# Patient Record
Sex: Male | Born: 1960 | Race: White | Hispanic: No | Marital: Married | Smoking: Never smoker
Health system: Southern US, Academic
[De-identification: ages and names within clinical notes are randomized; demographics above are authoritative.]

## PROBLEM LIST (undated history)

## (undated) DIAGNOSIS — I4891 Unspecified atrial fibrillation: Secondary | ICD-10-CM

---

## 2017-09-17 ENCOUNTER — Emergency Department (EMERGENCY_DEPARTMENT_HOSPITAL): Payer: Managed Care, Other (non HMO)

## 2017-09-17 ENCOUNTER — Encounter (HOSPITAL_COMMUNITY): Payer: Self-pay

## 2017-09-17 ENCOUNTER — Emergency Department (HOSPITAL_COMMUNITY): Payer: Managed Care, Other (non HMO)

## 2017-09-17 ENCOUNTER — Emergency Department
Admission: EM | Admit: 2017-09-17 | Discharge: 2017-09-17 | Disposition: A | Discharge status: Home / Self Care | Payer: Managed Care, Other (non HMO) | Attending: Emergency Medicine | Admitting: Emergency Medicine

## 2017-09-17 DIAGNOSIS — Z7982 Long term (current) use of aspirin: Secondary | ICD-10-CM | POA: Insufficient documentation

## 2017-09-17 DIAGNOSIS — M545 Low back pain: Secondary | ICD-10-CM | POA: Insufficient documentation

## 2017-09-17 DIAGNOSIS — M549 Dorsalgia, unspecified: Secondary | ICD-10-CM

## 2017-09-17 DIAGNOSIS — I4891 Unspecified atrial fibrillation: Secondary | ICD-10-CM | POA: Insufficient documentation

## 2017-09-17 DIAGNOSIS — R202 Paresthesia of skin: Secondary | ICD-10-CM | POA: Insufficient documentation

## 2017-09-17 DIAGNOSIS — M62830 Muscle spasm of back: Secondary | ICD-10-CM | POA: Insufficient documentation

## 2017-09-17 DIAGNOSIS — Z79899 Other long term (current) drug therapy: Secondary | ICD-10-CM | POA: Insufficient documentation

## 2017-09-17 HISTORY — DX: Unspecified atrial fibrillation (CMS HCC): I48.91

## 2017-09-17 LAB — URINALYSIS, MICROSCOPIC
RBCS: <1 /HPF (ref <6.0)
RBCS: <1 /hpf (ref <6.0)
WBCS: <1 /HPF (ref <4.0)

## 2017-09-17 LAB — URINALYSIS, MACROSCOPIC
BILIRUBIN: NEGATIVE mg/dL
BLOOD: NEGATIVE mg/dL
GLUCOSE: NEGATIVE mg/dL
KETONES: NEGATIVE mg/dL
LEUKOCYTES: NEGATIVE WBCs/uL
NITRITE: NEGATIVE
PH: 7 (ref 5.0–8.0)
SPECIFIC GRAVITY: 1.025 (ref 1.005–1.030)
UROBILINOGEN: NEGATIVE mg/dL

## 2017-09-17 MED ORDER — LIDOCAINE 5 % TOPICAL PATCH
1.00 | MEDICATED_PATCH | CUTANEOUS | Status: DC
Start: 2017-09-17 — End: 2017-09-17
  Administered 2017-09-17: 700 mg via TRANSDERMAL
  Filled 2017-09-17: qty 1

## 2017-09-17 MED ORDER — DIAZEPAM 5 MG TABLET
5.00 mg | ORAL_TABLET | ORAL | Status: AC
Start: 2017-09-17 — End: 2017-09-17
  Administered 2017-09-17 (×2): 5 mg via ORAL
  Filled 2017-09-17: qty 1

## 2017-09-17 MED ORDER — LIDOCAINE 5 % TOPICAL PATCH
1.0000 | MEDICATED_PATCH | Freq: Every day | CUTANEOUS | 0 refills | Status: AC
Start: 2017-09-17 — End: ?

## 2017-09-17 MED ORDER — KETOROLAC 60 MG/2 ML INTRAMUSCULAR SOLUTION
60.00 mg | INTRAMUSCULAR | Status: AC
Start: 2017-09-17 — End: 2017-09-17
  Administered 2017-09-17: 60 mg via INTRAMUSCULAR
  Filled 2017-09-17: qty 2

## 2017-09-17 MED ORDER — METHYLPREDNISOLONE 4 MG TABLETS IN A DOSE PACK: Tab | ORAL | 0 refills | 0 days | Status: AC

## 2017-09-17 NOTE — Discharge Instructions (Signed)
Please return to the ED if any worsening symptoms. Please follow up with spine and with your PCP.  Please call the number provided on the discharge instructions to verify appointment time with Spine.  Please start using Lidoderm patches and start taking medrol dose pack as prescribed.

## 2017-09-17 NOTE — ED Provider Notes (Signed)
[place] [facility]  Emergency Department  HPI - 09/17/2017    Chief Complaint:   Back pain    History of Present Illness:   Larry Manning, 56 y.o. male arrives to the [facility] ED via POV w chief complaint of low back pain for the last 2 days. Pt reports sx began when he dried off with a towel after showering. Patient is accompanied by his wife. States that this is the second episode of back pain this year, though is much worse than the previous episode. The pt states that his back pain radiates to his buttocks and BLE. Reports associated LLE tingling. The recent course of his pain was 6 hrs. States he has been taking Tylenol q6 hours with no relief. He states that he is able to ambulate with little to no assistance. He denies any fever, bowel or bladder incontinence, saddle anesthesia, hematuria, dysuria, decreased sensation, PMHx of cancer or diabetes, PSHx to his back, and any other associates symptoms and complaint at this time. Denies any smoking or EtOH or recreational drug usage. Reports taking 25 mg Metoprolol and ASA 325 mg daily.     Of note, patient does report ~3-4 years ago he had a ~16 foot fall from a ladder onto his back.     Significant PMH: Mild Afib.   History Limitations: None    Review of Systems:   Constitutional: No fever, chills weakness   Skin: No rashes or discharge   HENT: No headaches, congestion   Eyes: No vision changes, discharge   Cardio: No chest pain, palpitations or leg swelling    Respiratory: No cough, wheezing or SOB   GI:  No nausea, vomiting, diarrhea, constipation   GU:  No dysuria, hematuria, polyuria   MSK: No joint +back pain, +LLE pain +buttocks pain   Neuro: No loss of sensation, focal deficits +LLE tingling     Medications:  None     Allergies:  No Known Allergies    Past Medical History:  Past Medical History:   Diagnosis Date   . A-fib (CMS [facility])      Past Surgical History:  History reviewed. No past surgical history pertinent negatives.     Social History:  Social History     Social History   . Marital status: Married     Spouse name: N/A   . Number of children: N/A   . Years of education: N/A     Occupational History   . Not on file.     Social History Main Topics   . Smoking status: Never Smoker   . Smokeless tobacco: Never Used   . Alcohol use No   . Drug use: No   . Sexual activity: Not on file     Other Topics Concern   . Not on file     Social History Narrative   . No narrative on file     Family History:  Family Medical History:     None        Physical Exam:  All nurse's notes reviewed.  ED Triage Vitals   Enc Vitals Group      BP (Non-Invasive) 09/17/17 1136 142/84      Heart Rate 09/17/17 1136 63      Respiratory Rate 09/17/17 1136 18      Temperature 09/17/17 1136 35.7 C (96.3 F)      Temp src --       SpO2-1 09/17/17 1136 99 %  Weight 09/17/17 1136 133.6 kg (294 lb 8.6 oz)      Height 09/17/17 1136 1.88 m ( )      Patient does not need supplemental oxygen   Constitutional: NAD. A+Ox3   HENT:    Head: NC AT    Mouth/Throat: Oropharynx is clear and moist.    Eyes: EOMI, Conjunctivae without discharge   Neck: Trachea midline.    Cardiovascular: RRR, No murmurs, rubs or gallops.    Pulmonary/Chest: BS equal bilaterally, good air movement. No respiratory distress. No wheezes, rales or chest tenderness.    Abdominal: BS +. Abdomen soft, no tenderness, rebound or guarding.                Musculoskeletal: No obvious deformity or swelling. Lower lumbar midline and paraspinal TTP. Negative straight leg raise bilaterally.    Skin: Warm and dry. No rash, erythema, pallor or cyanosis   Neurological: Alert&Ox3. Grossly intact. 5/5 strength in the BLE. SILT    Labs:  Results for orders placed or performed during the hospital encounter of 09/17/17 (from the past 24 hour(s))   URINALYSIS, MACROSCOPIC AND MICROSCOPIC W/CULTURE REFLEX    Narrative    The following orders were created for panel order URINALYSIS, MACROSCOPIC AND  MICROSCOPIC W/CULTURE REFLEX.  Procedure                               Abnormality         Status                     ---------                               -----------         ------                     URINALYSIS, MACROSCOPIC[[id number]]      Normal              Final result               URINALYSIS, MICROSCOPIC[[id number]]      Abnormal            Final result                 Please view results for these tests on the individual orders.   URINALYSIS, MACROSCOPIC   Result Value Ref Range    SPECIFIC GRAVITY 1.025 1.005 - 1.030    GLUCOSE Negative Negative mg/dL    PROTEIN Negative Negative mg/dL    BILIRUBIN Negative Negative mg/dL    UROBILINOGEN Negative Negative mg/dL    PH 7.0 5.0 - 8.0    BLOOD Negative Negative mg/dL    KETONES Negative Negative mg/dL    NITRITE Negative Negative    LEUKOCYTES Negative Negative WBCs/uL    APPEARANCE Clear Clear    COLOR Normal (Yellow) Normal (Yellow)   URINALYSIS, MICROSCOPIC   Result Value Ref Range    WBCS <1.0 <4.0 /hpf    RBCS <1.0 <6.0 /hpf    BACTERIA Few (A) Occasional or less /hpf    MUCOUS Light Light /lpf     Imaging:  Results for orders placed or performed during the hospital encounter of 09/17/17 (from the past 72 hour(s))   XR LUMBAR SPINE AP AND LAT     Status: None  Narrative    Larry Manning  Male, 56 years old.    XR LUMBAR SPINE AP AND LAT performed on 09/17/2017 1:08 PM.    REASON FOR EXAM:  back pain    TECHNIQUE: 2 views/2 images submitted for interpretation.    COMPARISON:   None available      FINDINGS:  Lumbar spinal alignment, vertebral body heights, and  intervertebral disc space heights are preserved. There is no evidence of  acute fracture or traumatic malalignment. Scattered gas is seen throughout  nondilated bowel loops. Mild fecal retention is noted in the ascending and  descending colon. There is no evidence of obstruction.      Impression    No evidence of fracture or malalignment.       Orders Placed This Encounter   . XR LUMBAR  SPINE AP AND LAT   . URINALYSIS, MACROSCOPIC AND MICROSCOPIC W/CULTURE REFLEX   . URINALYSIS, MACROSCOPIC   . URINALYSIS, MICROSCOPIC   . SCHEDULE FOLLOW-UP [facility]   . ketorolac (TORADOL) /2 mL IM injection   . diazePAM (VALIUM) tablet   . Methylprednisolone (MEDROL DOSEPACK) 4 mg Oral Tablets, Dose Pack   . lidocaine (LIDODERM) 5 % Adhesive Patch, Medicated     Abnormal Lab results:  Labs Reviewed   URINALYSIS, MICROSCOPIC - Abnormal; Notable for the following:        Result Value    BACTERIA Few (*)     All other components within normal limits   URINALYSIS, MACROSCOPIC - Normal   URINALYSIS, MACROSCOPIC AND MICROSCOPIC W/CULTURE REFLEX    Narrative:     The following orders were created for panel order URINALYSIS, MACROSCOPIC AND MICROSCOPIC W/CULTURE REFLEX.  Procedure                               Abnormality         Status                     ---------                               -----------         ------                     URINALYSIS, MACROSCOPIC[[id number]]      Normal              Final result               URINALYSIS, MICROSCOPIC[[id number]]      Abnormal            Final result                 Please view results for these tests on the individual orders.     Plan: Appropriate labs and imaging ordered. Medical Records reviewed.    Therapy/Procedures/Course/MDM:   Patient's vitals were stable throughout visit.   Patient presents with 2 day hx of low back pain  Hx of previous back spasms.  Patient denies having any recent trauma,  bowel or bladder incontinence, saddle anesthesia, fever, hematuria, history of diabetes, IV drug use, cancer.  Low concern for cord compression based on hx and PE.    Patient has PVR < 5.   XR LUMBAR SPINE AP AND LAT Imaging remarkable for: No evidence of fracture  or malalignment per radiology impression.  Lab results as shown above. UA unremarkable  Patient received: Lidocaine, Toradol, and Valium  Patient tolerated ambulatory trial.  Results discussed  with patient.  Patient expressed improvement in symptoms on repeat examination.  Patient was deemed stable for discharge.  Prescriptions were written for Lidoderm patches and for Medrol Dosepak.  Referral was placed with [facility].  Patient is to follow up with his PCP.  Patient is to return to the ED with any worsening/concerning symptoms. Patient in agreement with plan.    Consults: None    Impression:   Encounter Diagnosis   Name Primary?   . Back pain, unspecified back location, unspecified back pain laterality, unspecified chronicity Yes     Disposition:     Following the above history, physical exam, and studies, the patient was deemed stable and suitable for discharge.   Medrol Dosepack and lidocaine patch were prescribed.  Medication instructions were discussed with the patient/patient's family.   It was advised that the patient return to the ED with any new, concerning or worsening symptoms and follow up as directed.    The patient's family verbalized understanding of all instructions and had no further questions or concerns.     I am scribing for, and in the presence of, London Pepper, for services provided on 09/17/2017.     Volkan Ozyar, SCRIBE  Vivia Budge, SCRIBE  09/17/2017 2:04 PM    I personally performed the services described in this documentation, as scribed  in my presence, and it is both accurate  and complete.    London Pepper, MD      London Pepper, MD  [facility] Emergency Medicine

## 2017-09-17 NOTE — ED Nurses Note (Signed)
Discharge papers explained. Pt has no further questions at this time. 2 prescriptions given. Pt ambulatory from ED upon discharge.

## 2017-09-17 NOTE — ED Nurses Note (Signed)
Patient has been transported to xray via stretcher and RN escort.

## 2017-09-17 NOTE — ED Nurses Note (Signed)
Patient presents to the ED with complaints of lower back pain with radiation down his left leg. The patient states it started when he stepped out of the shower on Saturday. The patient states he his having numbness and tingling in his left leg and foot. The patient denies lose of bowel or bladder functions. He also denies saddle numbness and tingling. The patient does not have a history of back surgery but does state that this has happened before but usually it passes in a day or two. The patient is complaining of 7/10 sharp shooting pain in his left lower back. The patient's only pertinent medical history is a-fib. The patient denies nausea, vomiting or diarrhea. The patient denies chest pain or shortness of breath. The patient is sitting on the side of the bed at this time. ED team at bedside for evaluation, call bell is within reach.

## 2017-09-17 NOTE — ED Attending Note (Signed)
I was physically present and directly supervised this patient's care. Patient was seen and examined. The midlevel's/resident's history and exam were reviewed. Key elements in addition to and/or correction of that documentation are as follows:    Chief Complaint   Patient presents with    Back Pain     pt states "thrown back out". pt has old back injury. happened Saterday and will not go away.       Larry Manning is a 56 y.o. male p/w back pain. Patient states he you was tried off of the towel when he got out of the shower 2 days ago and then throughout his back.  Patient with low back spasms.  Patient states he has had this happened to him before.  He denies any saddle anesthesia, urinary retention or loss of bowel control.  Patient denies weakness in his legs.  Patient does state that the back pain does radiate down his right leg.  He is able to ambulate.  He denies fevers or any other trauma. He denies IV drug use.  He denies history of cancer.    Pertinent Exam  Filed Vitals:    09/17/17 1136   BP: (!) 142/84   Pulse: 63   Resp: 18   Temp: 35.7 C (96.3 F)   SpO2: 99%     Agree w resident    Course   patient with back pain.  Patient has no concerning symptoms for cauda equina.  Patient with negative x-ray.  Patient was feeling improved with medications in the emergency department.  Patient is ambulatory without difficulty.  Patient was discharged in stable condition.    Results reviewed.       Impressions: agree w resident    Dispo: Discharged    Additional verbal discharge instructions were given and discussed with the patient    Leary Roca, MD 09/17/2017, 12:13  Emergency Medicine Attending    Chart completed after conclusion of patient care due to time constraints of direct patient care during shift.     THIS NOTE IS CREATED USING VOICE RECOGNITION SOFTWARE.  THE CHART HAS BEEN REVIEWED IN REAL TIME.  ERRORS AND OMISSIONS THAT WERE MISSED ARE UNINTENDED.

## 2017-09-17 NOTE — ED Nurses Note (Signed)
Patient ambulated to the restroom with a standby assist. The patient states his pain is still present but is slightly better.

## 2017-09-18 ENCOUNTER — Ambulatory Visit: Payer: Managed Care, Other (non HMO)

## 2017-09-25 ENCOUNTER — Ambulatory Visit
Payer: Managed Care, Other (non HMO) | Attending: PHYSICAL MEDICINE AND REHABILITATION | Admitting: PHYSICAL MEDICINE AND REHABILITATION

## 2017-09-25 VITALS — BP 114/75 | HR 52 | Ht 74.0 in | Wt 299.2 lb

## 2017-09-25 DIAGNOSIS — Z7982 Long term (current) use of aspirin: Secondary | ICD-10-CM | POA: Insufficient documentation

## 2017-09-25 DIAGNOSIS — M79605 Pain in left leg: Secondary | ICD-10-CM | POA: Insufficient documentation

## 2017-09-25 DIAGNOSIS — Z79899 Other long term (current) drug therapy: Secondary | ICD-10-CM | POA: Insufficient documentation

## 2017-09-25 DIAGNOSIS — S335XXA Sprain of ligaments of lumbar spine, initial encounter: Secondary | ICD-10-CM

## 2017-09-25 DIAGNOSIS — M545 Low back pain, unspecified: Secondary | ICD-10-CM | POA: Insufficient documentation

## 2017-09-25 DIAGNOSIS — S39012A Strain of muscle, fascia and tendon of lower back, initial encounter: Secondary | ICD-10-CM | POA: Insufficient documentation

## 2017-09-25 DIAGNOSIS — Z791 Long term (current) use of non-steroidal anti-inflammatories (NSAID): Secondary | ICD-10-CM | POA: Insufficient documentation

## 2017-09-25 NOTE — Progress Notes (Signed)
To be dictated  Santa Generaavid December Hedtke, MD 09/25/2017, 08:28

## 2017-09-26 NOTE — Progress Notes (Signed)
PATIENT NAME: Larry Manning, Larry Manning  HOSPITAL NUMBER:  [id number]  DATE OF SERVICE: 09/25/2017  DATE OF BIRTH:  [birth year]    CLINIC NOTE    HISTORY OF PRESENT ILLNESS:  Larry Manning is a 56 year old white male who noticed on September 14, 2017, when he was toweling off severe low back and left leg symptoms.  He relates it was severe enough that he went to the Emergency Department on September 17, 2017.  He had a lumbar spine x-ray done which was unremarkable, placed on Medrol dose pack and a muscle relaxer.  He relates he is doing significantly better.  He still has some left-sided low back pain.  No significant leg pain.  No weakness.  Bowel and bladder function intact.  He relates the left leg did feel a little weak at times and is starting to come back.  He has finished his steroid packs and is now on Tylenol p.r.n.  He relates no recent history back problems, but he relates in high school he may have injured his low back and then he had a fall about 2 years ago from a ladder that could have his back, but no ongoing back troubles.    PAST MEDICAL HISTORY:  He relates he has had some intermittent atrial fibrillation in the past.    PAST SURGICAL HISTORY:  Unremarkable.    CURRENT MEDICATIONS:  1. Tylenol p.r.n.  2. Aspirin.   3. Lopressor.    SOCIAL HISTORY:  He works in [employer].  He works full time.  He does not smoke and does not use any alcohol.    FAMILY HISTORY:  Positive for diabetes and cancer.    REVIEW OF SYSTEMS:  He has some urinary frequency, no incontinence.  Some recent constipation.    PHYSICAL EXAMINATION:  On exam, height 6 feet 2 inches, weight 135 kg, blood pressure 114/75, pulse 52 and regular.  He is alert, oriented, pleasant, cooperative, no acute distress.  Lungs:  Clear.  Heart:  Regular rate and rhythm.  Abdomen:  Bowel sounds positive, soft and nontender.  Strength in upper and lower extremities 5/5 reflexes symmetric 1-2/4.  Negative straight leg raise.  Back:  Some tenderness in the  left paralumbar region.    IMPRESSION:  Lumbosacral strain with some radicular symptoms down the left leg much improved after steroid pack.    PLAN:  We will begin some physical therapy and see how he progresses with that.  We will see him back in a few months.  He will call in if the symptoms would increase.        Larry Generaavid Anyae Griffith, MD  Assistant Professor   [facility]            CC:   Larry RocaNicole L Dorinzi, MD   Fax: [phone]       DD:  09/25/2017 11:58:15  DT:  09/26/2017 05:47:39 DW  D#:  [id number]

## 2018-01-28 ENCOUNTER — Encounter (INDEPENDENT_AMBULATORY_CARE_PROVIDER_SITE_OTHER): Payer: Managed Care, Other (non HMO) | Admitting: PHYSICAL MEDICINE AND REHABILITATION

## 2018-08-27 LAB — URINALYSIS, MACROSCOPIC

## 2019-08-17 ENCOUNTER — Other Ambulatory Visit: Payer: Self-pay

## 2022-11-07 IMAGING — CR Chest
1 series · 1 of 1 positions shown · non-contrast
Comparison: None

FINAL REPORT:
Chest portable one view
INDICATION: dyspnea

[AP]
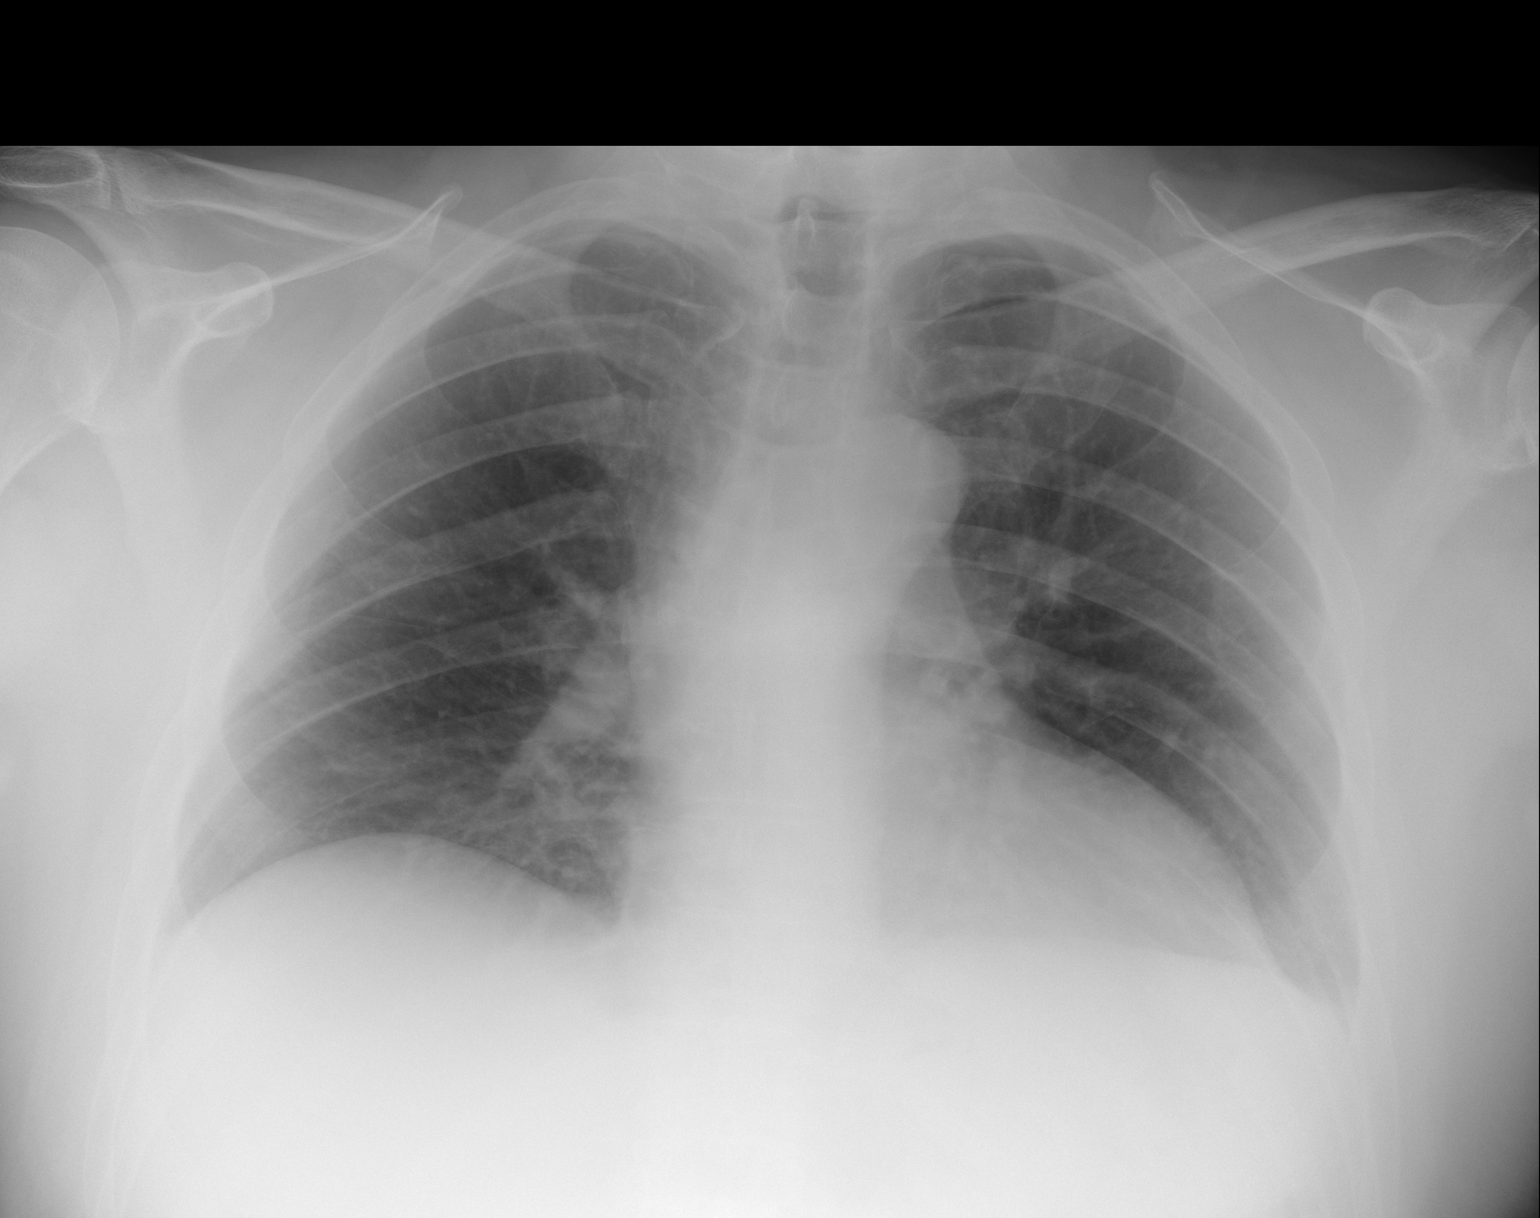

[1 of 1 positions shown; findings below may reference images not displayed]

FINDINGS: The cardiac silhouette is within normal limits   given portable technique.  The lungs are clear.   No pneumothorax. No pleural effusion.   No acute osseous abnormalities.
IMPRESSION: 
IMPRESSION: No acute cardiopulmonary process.

## 2022-11-07 IMAGING — MR MRI THORACIC SPINE WITHOUT CONTRAST
7 series · 46 of 48 positions shown · non-contrast
Comparison: None.

FINAL REPORT:
EXAM: Thoracic spine MRI without contrast.
HISTORY: .infection suspected
TECHNIQUE: Multisequence multiplanar images were obtained of the thoracic spine.

[Series 10: aa survey whole · coronal · 1.7mm · 1.67mm/px · 18 of 93 slices shown (1 of 2)]
[im 1/93]
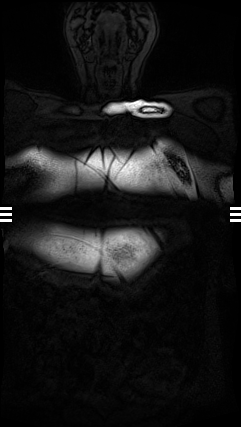
[im 5/93]
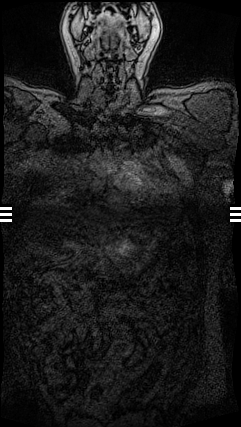
[im 10/93]
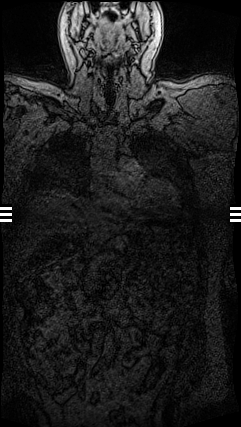
[im 15/93]
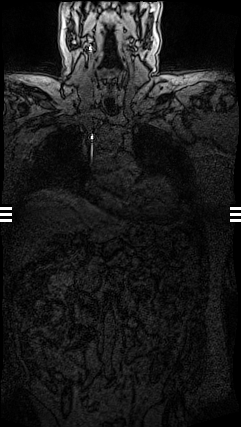
[im 20/93]
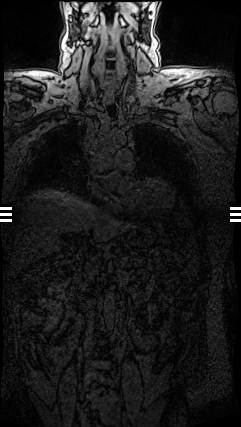
[im 25/93]
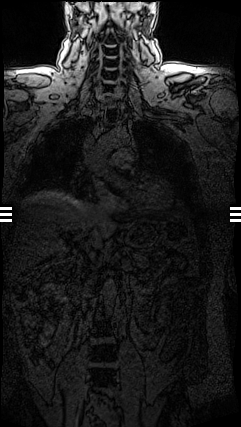
[im 30/93]
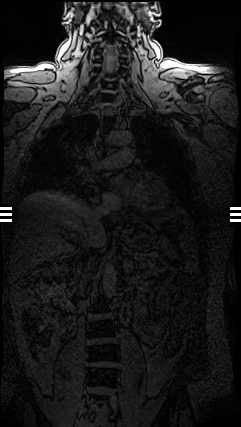
[im 34/93]
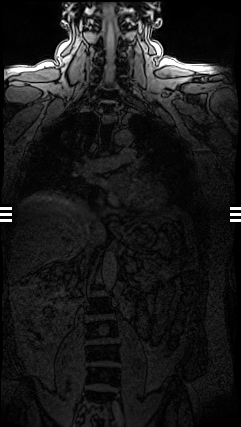
[im 39/93]
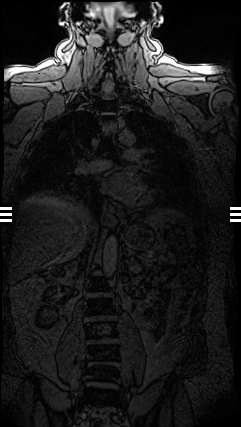
[im 44/93]
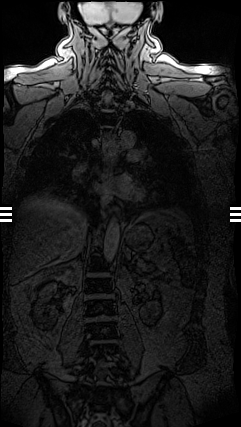
[im 49/93]
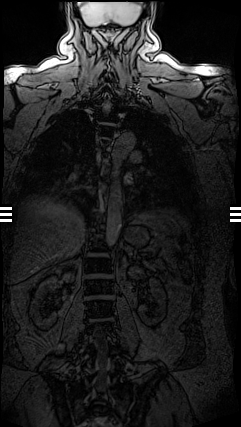
[im 54/93]
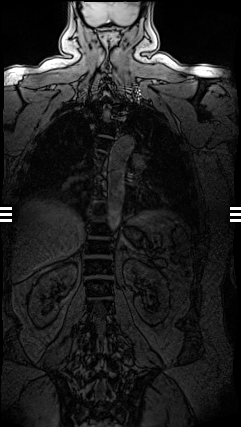
[im 59/93]
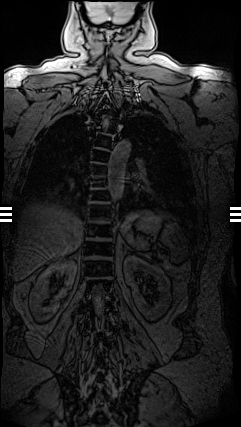
[im 63/93]
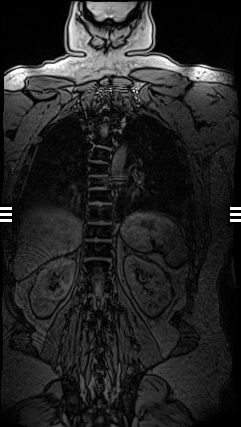
[im 68/93]
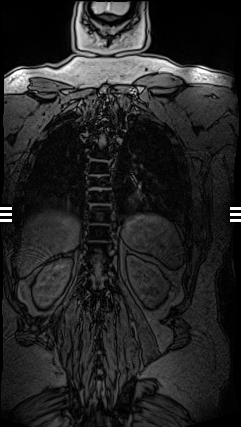
[im 73/93]
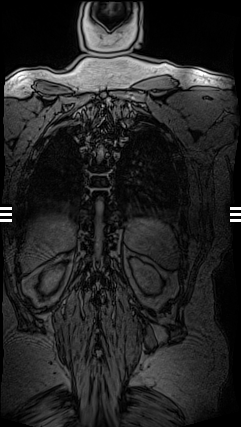
[im 78/93]
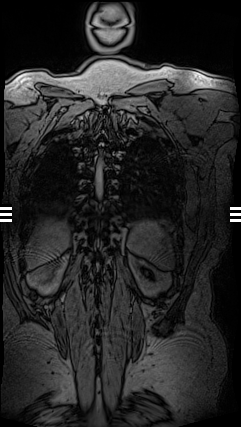
[im 88/93]
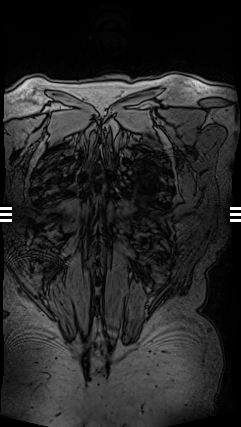

[Series 12: aa survey whole · sagittal · 1.7mm · 1.67mm/px · 3 of 15 slices shown (2 of 2)]
[im 1/15]
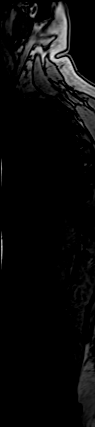
[im 8/15]
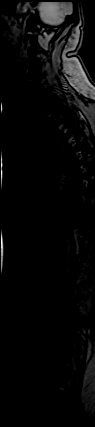
[im 15/15]
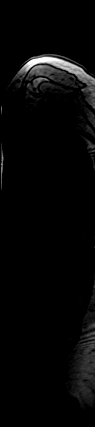

[Series 13: T2 · sagittal · 4.0mm · 0.74mm/px · 3 of 15 slices shown (1 of 3)]
[im 1/15]
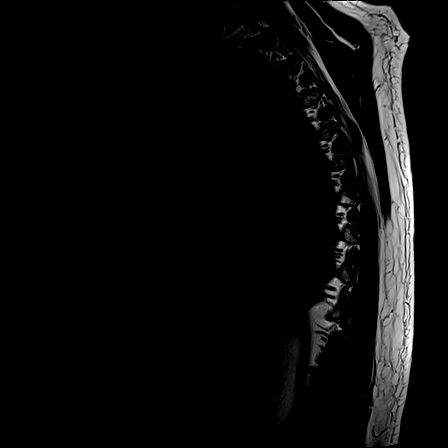
[im 8/15]
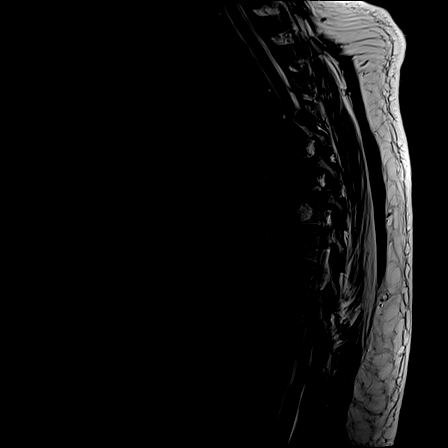
[im 15/15]
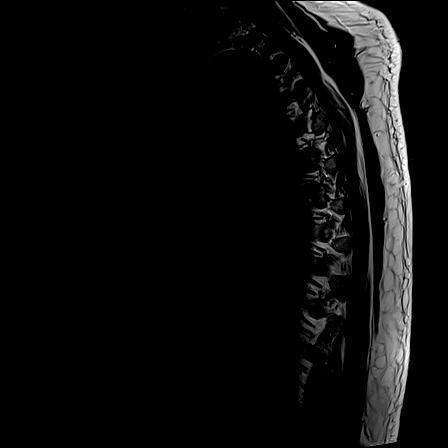

[Series 14: T1 · sagittal · 4.0mm · 0.74mm/px · 3 of 15 slices shown]
[im 1/15]
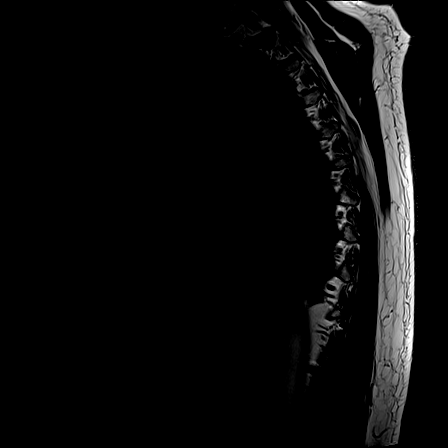
[im 8/15]
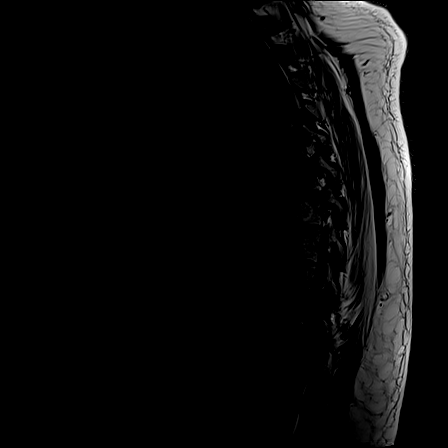
[im 15/15]
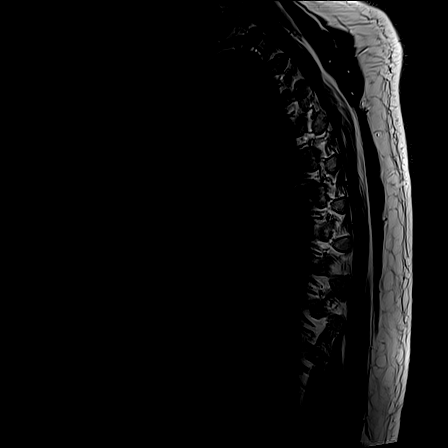

[Series 15: STIR · sagittal · 4.0mm · 0.74mm/px · 3 of 15 slices shown]
[im 1/15]
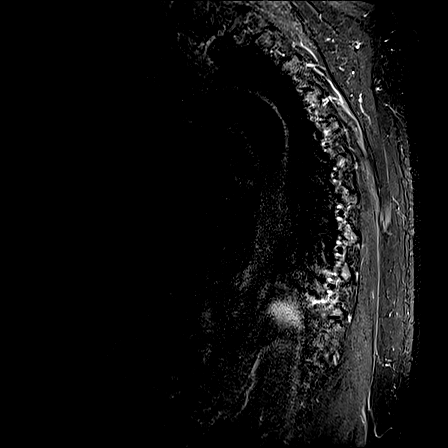
[im 8/15]
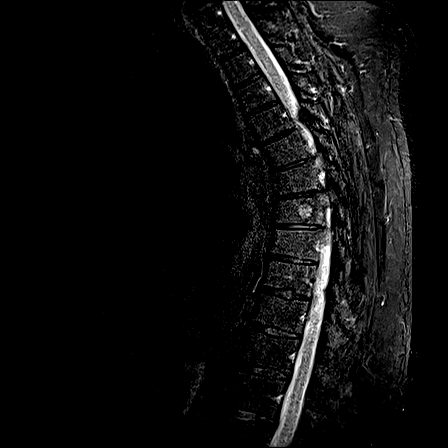
[im 15/15]
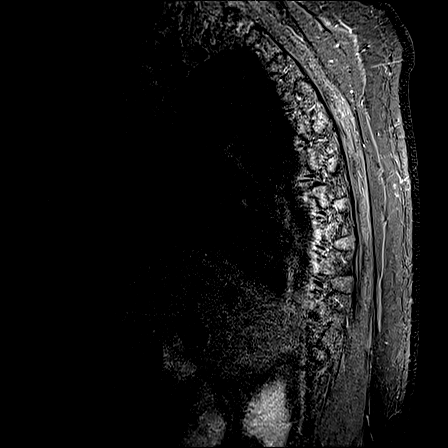

[Series 16: T2 · axial · 4.0mm · 0.78mm/px · z∈[-206,-42]mm · 8 of 40 slices shown (2 of 3)]
[im 1/40]
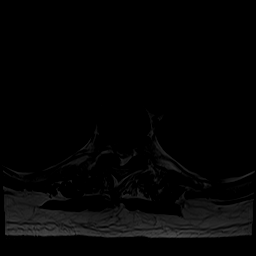
[im 6/40]
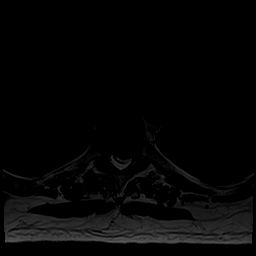
[im 12/40]
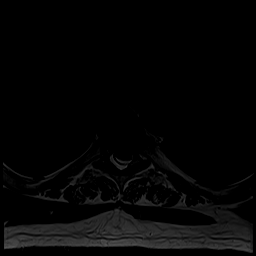
[im 17/40]
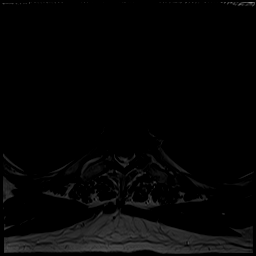
[im 23/40]
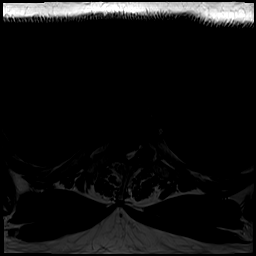
[im 28/40]
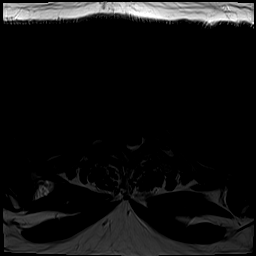
[im 34/40]
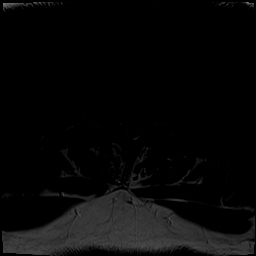
[im 40/40]
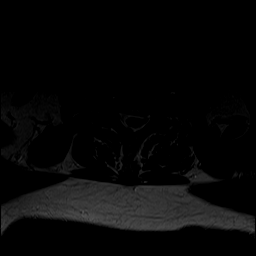

[Series 17: T2 · axial · 4.0mm · 0.78mm/px · z∈[-312,-144]mm · 8 of 40 slices shown (3 of 3)]
[im 1/40]
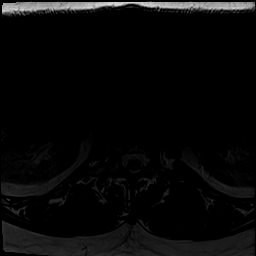
[im 6/40]
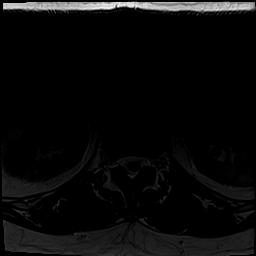
[im 12/40]
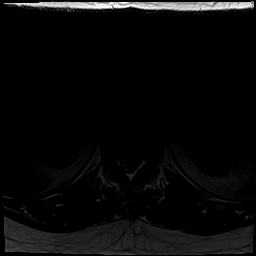
[im 17/40]
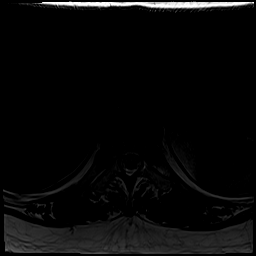
[im 23/40]
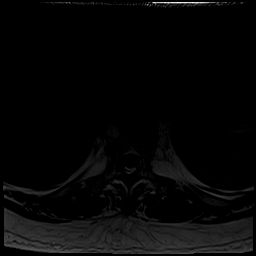
[im 28/40]
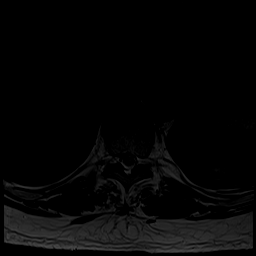
[im 34/40]
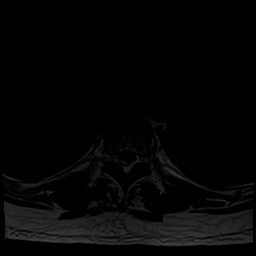
[im 40/40]
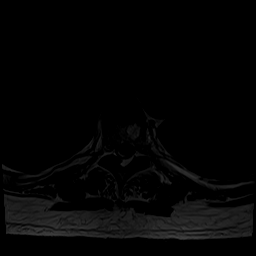

[46 of 48 positions shown; findings below may reference images not displayed]

FINDINGS: Slight rightward curvature of the midthoracic spine..  No fractures are seen.
Vertebral bodies demonstrate normal height. Normal background marrow signal. Likely hemangiomas in the T7 and T10 vertebral bodies.
The spinal cord shows no intrinsic signal abnormality. Paraspinal soft tissues appear unremarkable.
No significant spinal canal or neural foraminal stenosis at any level.
IMPRESSION: No acute abnormality in the thoracic spine.

## 2022-11-07 IMAGING — MR MRI LUMBAR SPINE WITHOUT CONTRAST
7 series · 44 of 48 positions shown · non-contrast
Comparison: None.

FINAL REPORT:
EXAM: Lumbar Spine MRI Without Contrast.
HISTORY: Low back pain, infection suspected .
TECHNIQUE: Multisequence multiplanar images of the lumbar spine were obtained.

[Series 11: aa survey whole · coronal · 1.7mm · 1.67mm/px · 14 of 93 slices shown (1 of 2)]
[im 1/93]
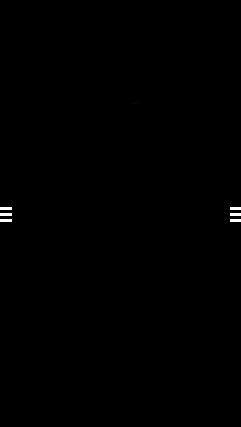
[im 6/93]
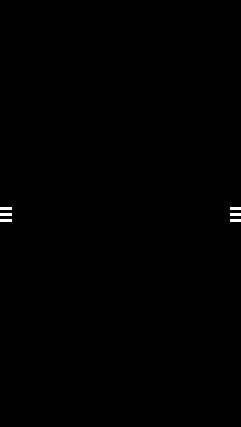
[im 11/93]
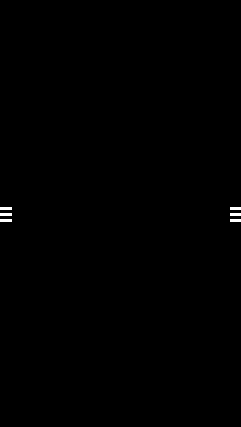
[im 17/93]
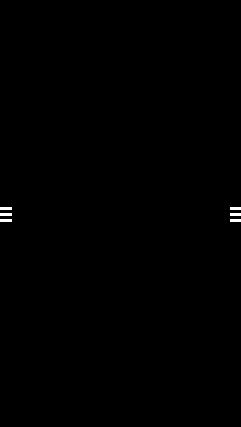
[im 22/93]
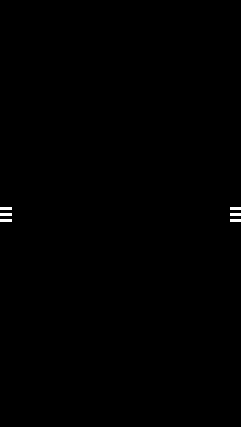
[im 28/93]
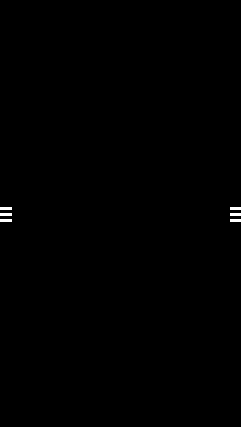
[im 33/93]
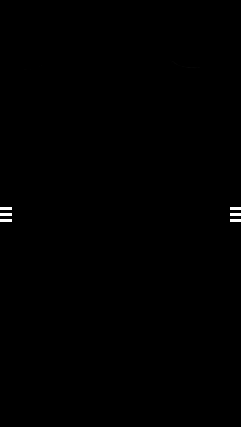
[im 38/93]
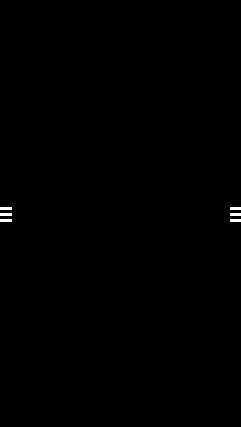
[im 44/93]
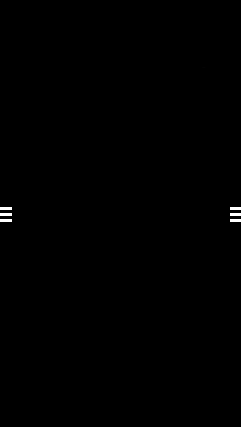
[im 49/93]
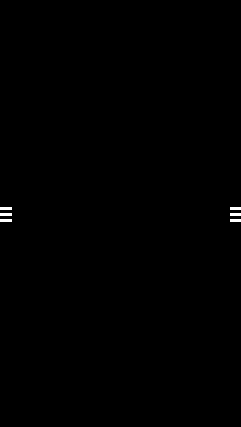
[im 55/93]
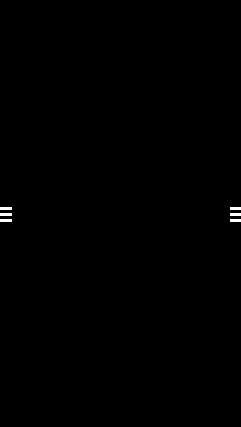
[im 65/93]
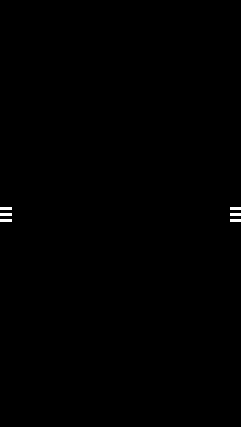
[im 76/93]
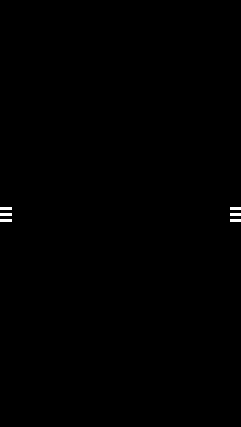
[im 87/93]
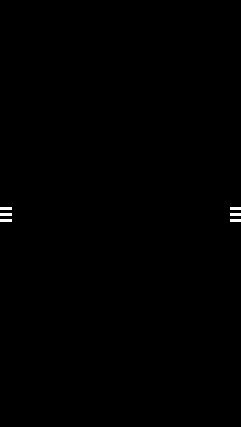

[Series 13: aa survey whole · sagittal · 1.7mm · 1.67mm/px · 3 of 15 slices shown (2 of 2)]
[im 1/15]
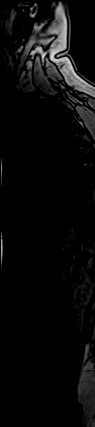
[im 8/15]
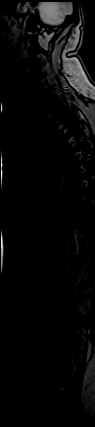
[im 15/15]
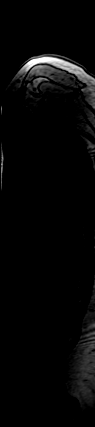

[Series 14: T2 · sagittal · 4.0mm · 0.62mm/px · 3 of 17 slices shown (1 of 2)]
[im 1/17]
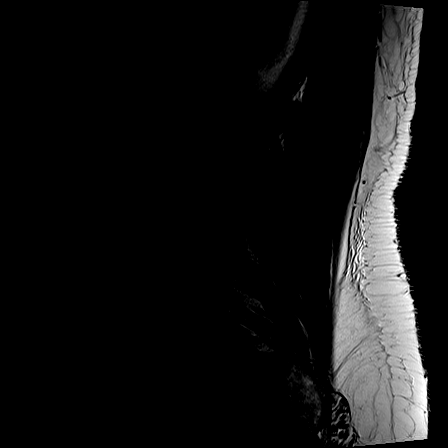
[im 9/17]
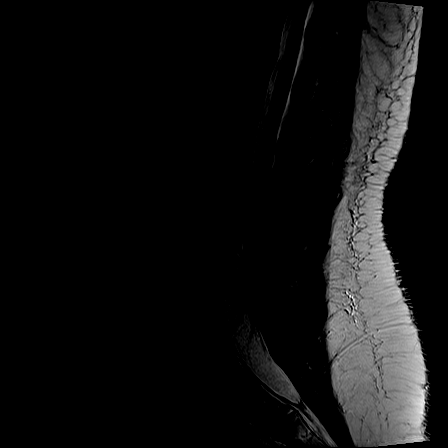
[im 17/17]
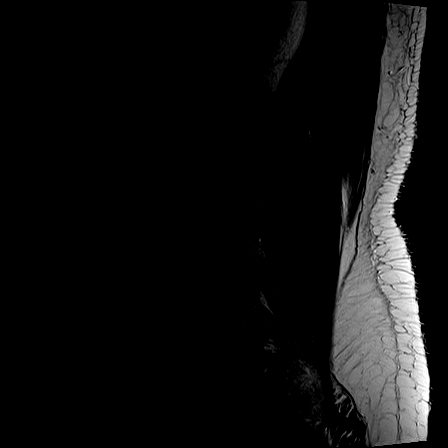

[Series 15: T1 · sagittal · 4.0mm · 0.73mm/px · 3 of 17 slices shown (1 of 2)]
[im 1/17]
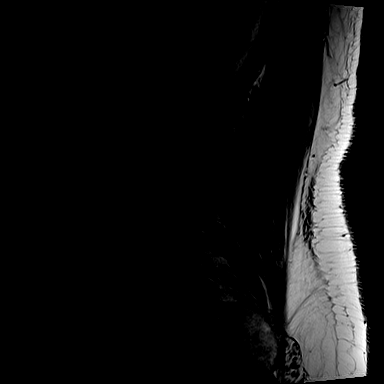
[im 9/17]
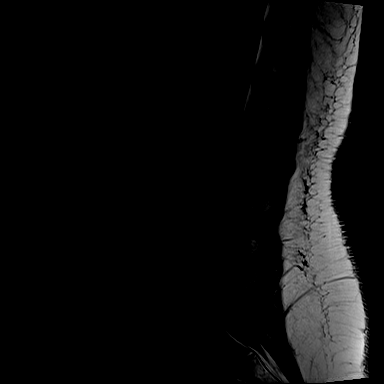
[im 17/17]
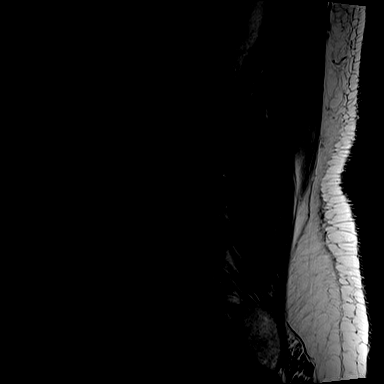

[Series 16: STIR · sagittal · 4.0mm · 0.62mm/px · 3 of 17 slices shown]
[im 1/17]
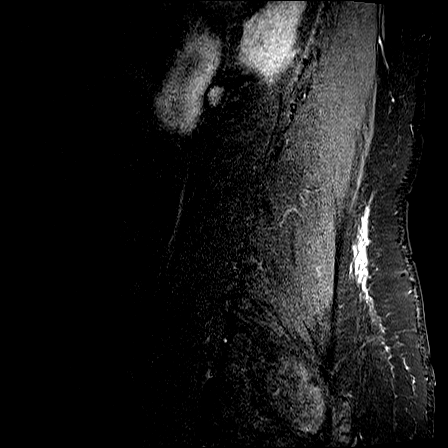
[im 9/17]
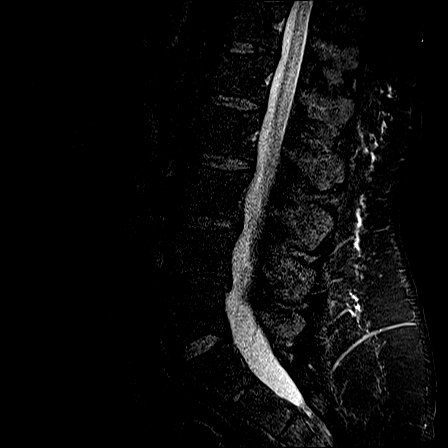
[im 17/17]
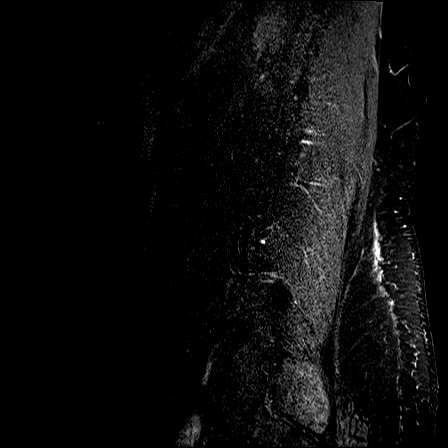

[Series 19: T2 · axial · 4.0mm · 0.69mm/px · z∈[-530,-290]mm · 9 of 51 slices shown (2 of 2)]
[im 1/51]
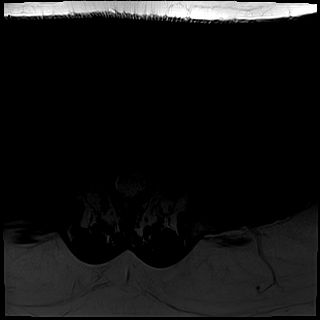
[im 7/51]
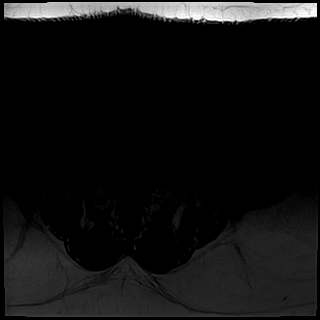
[im 13/51]
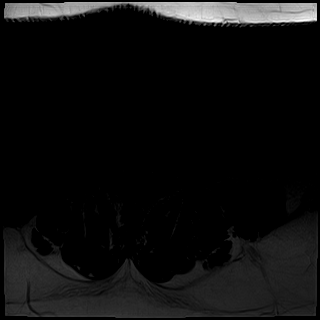
[im 19/51]
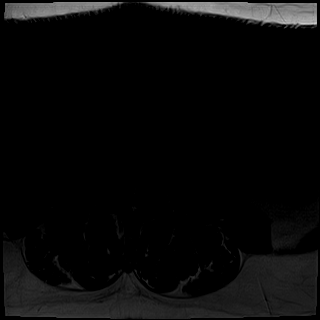
[im 26/51]
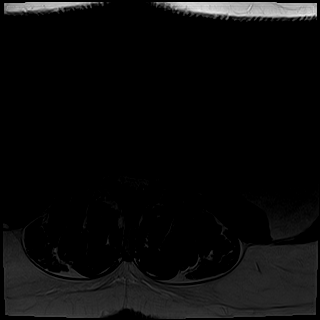
[im 32/51]
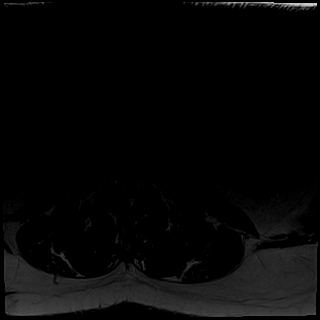
[im 38/51]
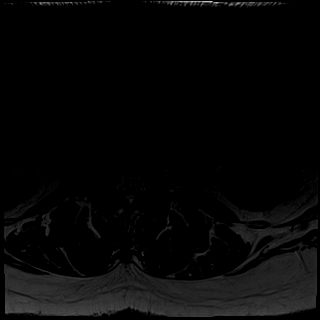
[im 44/51]
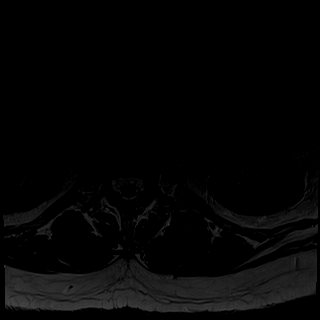
[im 51/51]
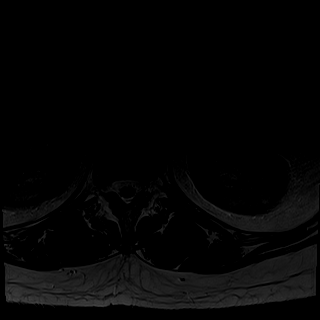

[Series 22: T1 · axial · 4.0mm · 0.86mm/px · z∈[-529,-289]mm · 9 of 51 slices shown (2 of 2)]
[im 1/51]
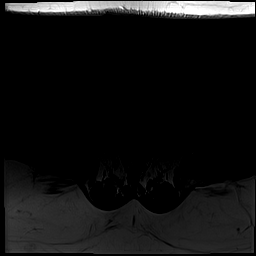
[im 7/51]
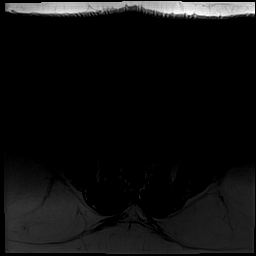
[im 13/51]
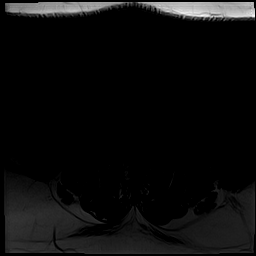
[im 19/51]
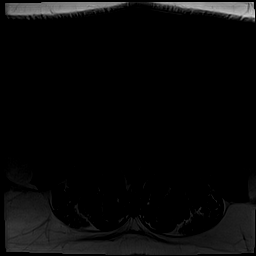
[im 26/51]
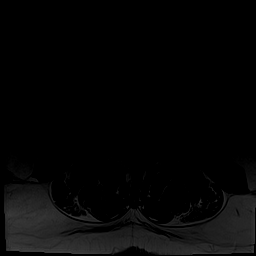
[im 32/51]
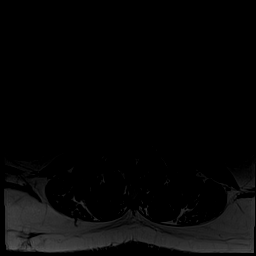
[im 38/51]
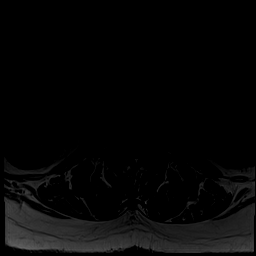
[im 44/51]
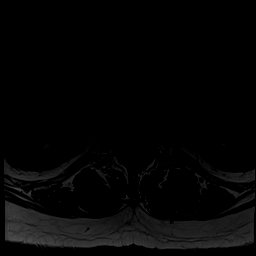
[im 51/51]
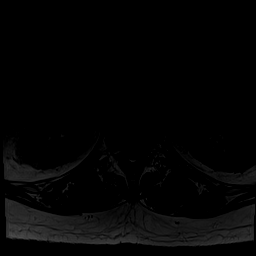

[44 of 48 positions shown; findings below may reference images not displayed]

FINDINGS: Sagittal images extend from T11 through S1. Axial images extend from L1 through S1. Multilevel intervertebral disc space height loss. Five lumbar-type vertebral bodies are assumed counting from the sacrum at the lowest articulating disc..
Spinal alignment is within normal limits.  No fractures are seen.  Vertebral bodies demonstrate normal height. Normal background marrow signal. Likely hemangioma in the L3 vertebral body. The distal cord shows no abnormal signal.
The conus medullaris terminates at L2, and appears normal in contour and signal.
The abdominal aorta is normal in caliber.
Findings at individual disc levels are as follows:
T12-L1: No significant spinal canal or neural foraminal stenosis.
L1-2: No significant spinal canal or neural foraminal stenosis.
L2-3: Mild circumferential disc bulge. No significant spinal canal or neural foraminal stenosis.
L3-4: Mild circumferential disc bulge. No significant spinal canal or neural foraminal stenosis.
L4-5: Circumferential disc bulge. No significant spinal canal stenosis. Mild bilateral neural foraminal stenosis.
L5-S1: No significant spinal canal stenosis. Mild bilateral neural foraminal stenosis.
IMPRESSION: 1. No acute abnormality in the lumbar spine.
2. Mild degenerative changes in the lumbar spine.

## 2022-12-12 IMAGING — CT CT CHEST WITH WITHOUT CONTRAST
3 of 7 series · 11 of 31 positions shown, 12 images · IV contrast (ISOVUE 370)
Comparison: 11/07/2022.

F/u AORTIC ANEURYSM, DENIES PROBLEMS, NO CANCER HX, SURG HX---ABLASION
FINAL REPORT:
EXAM: CT CHEST WITH WITHOUT CONTRAST
INDICATION: Encounter for screening for malignant neoplasm of colon
TECHNIQUE: CT chest without and with IV contrast using multiplanar reconstructions. MIPs were created.
All CT scans at this facility use dose modulation and/or weight based dosing when appropriate to reduce radiation dose to as low as reasonably achievable. (#[id number]#)

[Series 3: dissection · axial · 0.89mm/px · z∈[-189,-84]mm · 3 of 127 slices shown, 4 images]
[im 43/127  mediastinal]
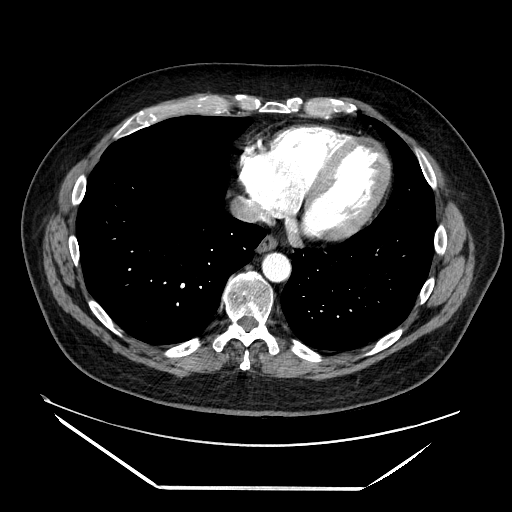
[im 43/127  lung]
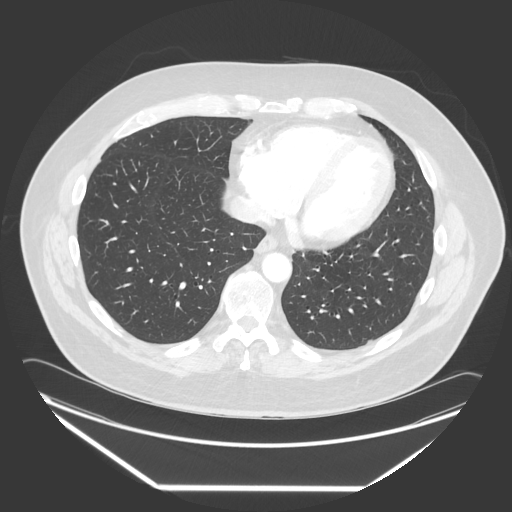
[im 69/127  lung]
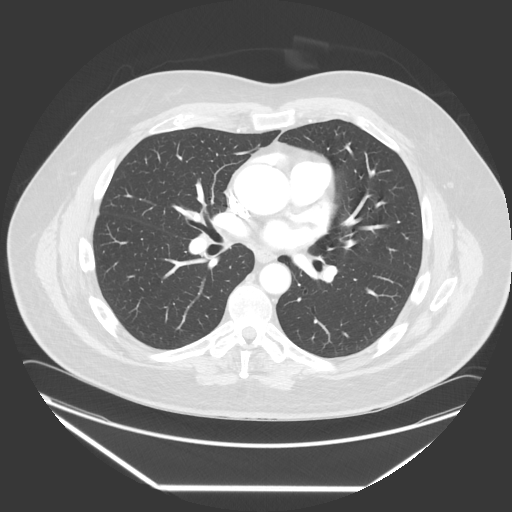
[im 85/127  lung]
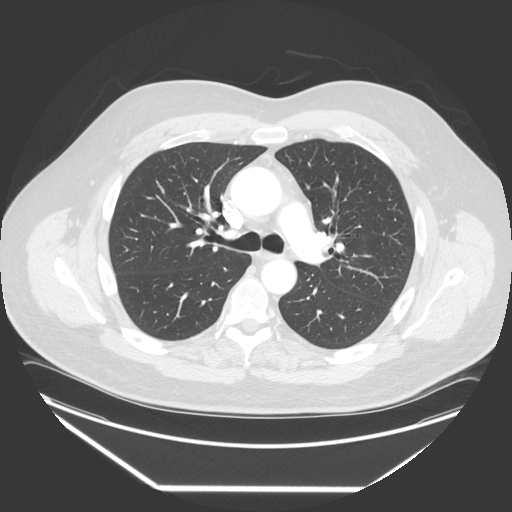

[Series 602: sagittal · sagittal · 0.89mm/px · 5 of 230 slices shown]
[im 39/230  lung]
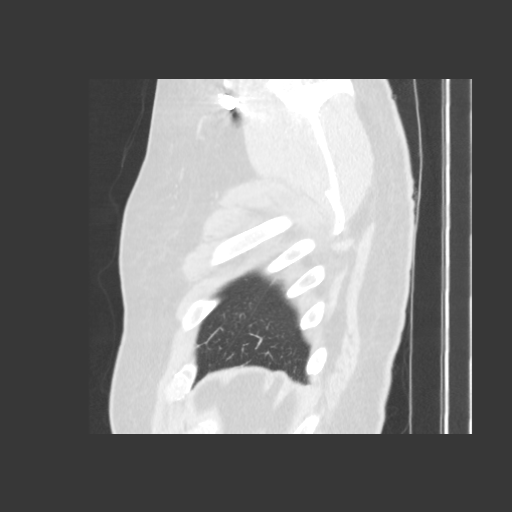
[im 77/230  lung]
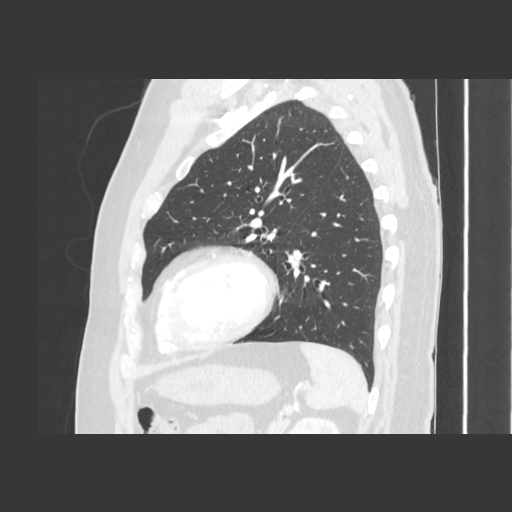
[im 115/230  lung]
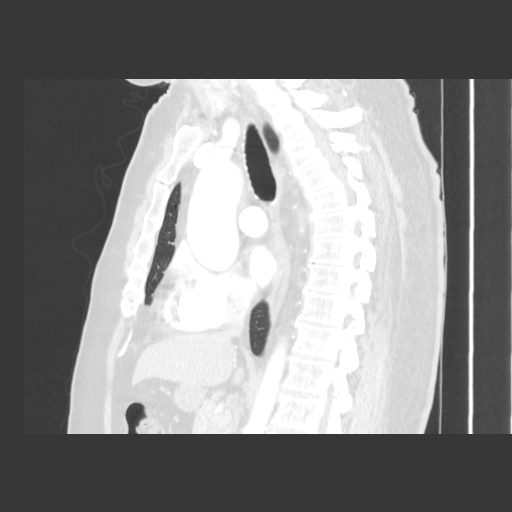
[im 153/230  lung]
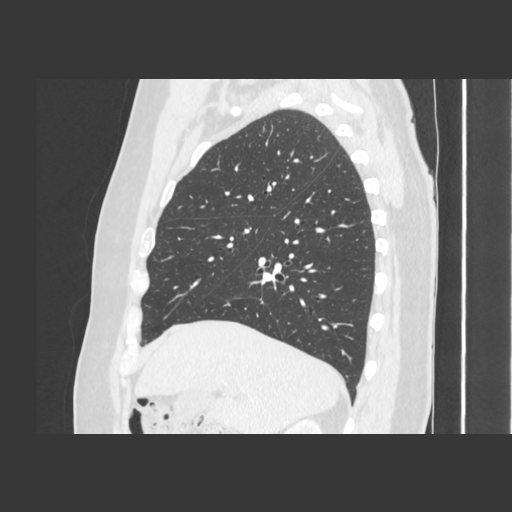
[im 191/230  lung]
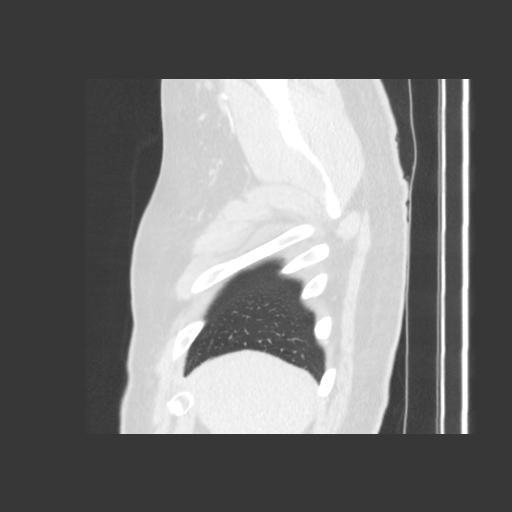

[Series 604: coronal · coronal · 0.89mm/px · 3 of 170 slices shown]
[im 43/170  lung]
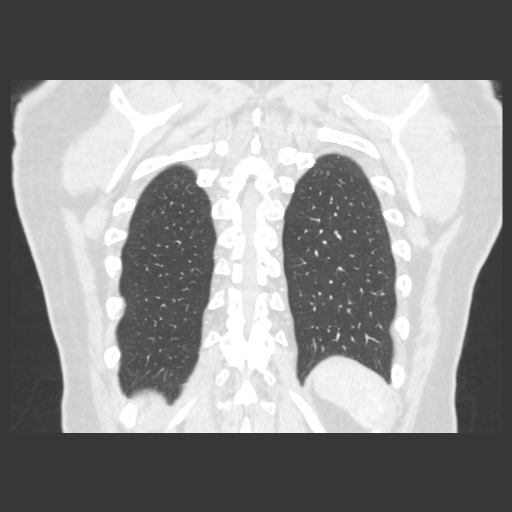
[im 85/170  lung]
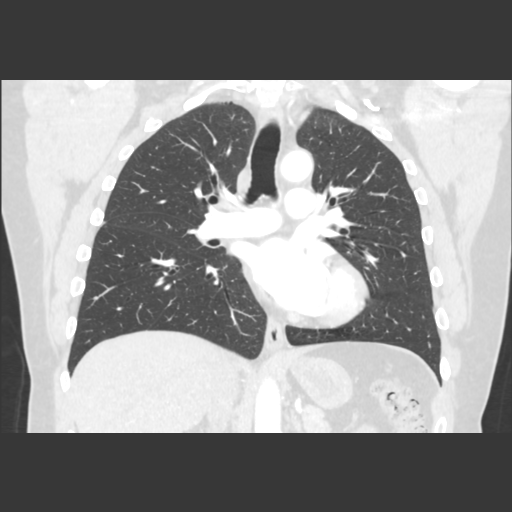
[im 127/170  lung]
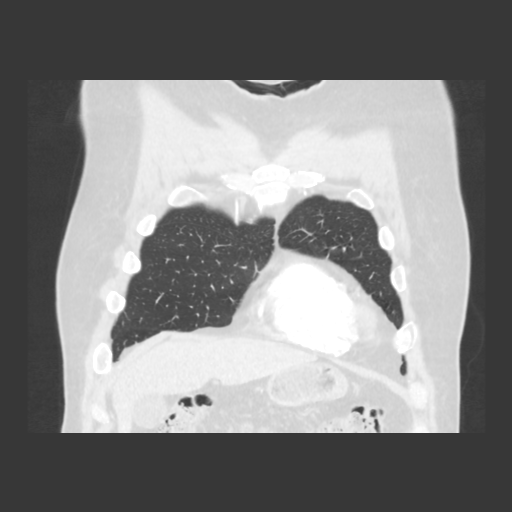

[11 of 31 positions shown; findings below may reference images not displayed]

FINDINGS: HEART: Unremarkable.
VASCULATURE: Ascending aorta measures 4.1 x 4.3 cm. Otherwise unremarkable.
MEDIASTINUM \T\ HILA: No lymphadenopathy.
CHEST WALL \T\ AXILLA: Unremarkable.
AIRWAYS \T\ LUNGS: Central airways are patent. Lungs are clear.
BONES: Degenerative changes throughout the visualized spine.
UPPER ABDOMEN: Unremarkable.
IMPRESSION: 1.  No acute intrathoracic findings.
2.  No evidence of metastatic disease.
3.  Ascending aorta measures up to 4.3 cm.

## 2024-02-11 IMAGING — CT CT LUNG FIELD REVIEW
1 series · 16 of 33 positions shown, 20 images · non-contrast
Comparison: none

FINAL REPORT:
Procedure: CT cardiac scoring overread
TECHNIQUE: Axial images were obtained through portions of the chest.
INDICATION: Pure hypercholesterolemia, unspecified

[Series 302: lung · axial · 0.70mm/px · z∈[-248,-88]mm · 16 of 70 slices shown, 20 images]
[im 3/70  mediastinal]
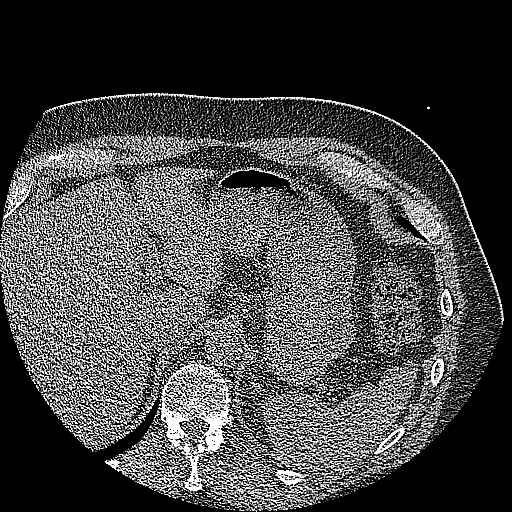
[im 3/70  lung]
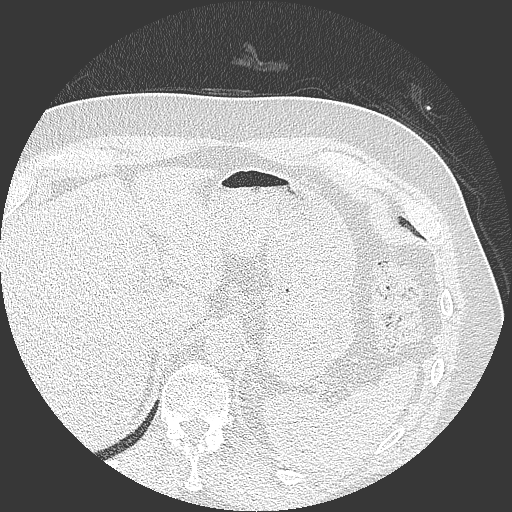
[im 8/70  lung]
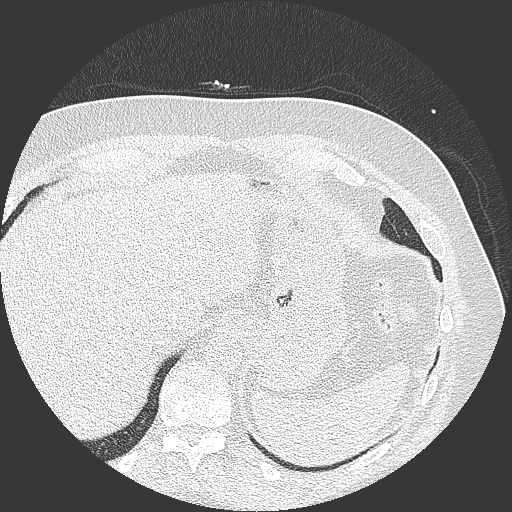
[im 13/70  lung]
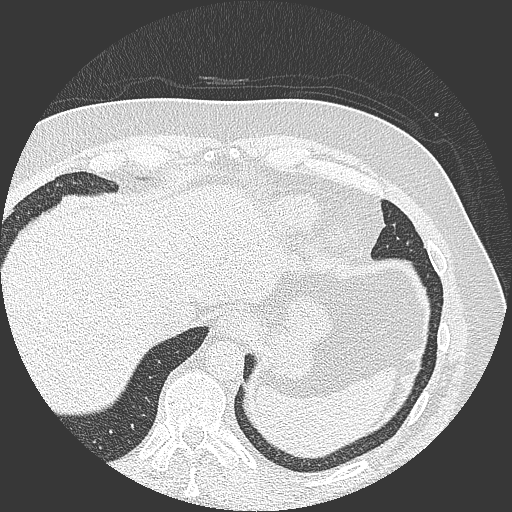
[im 16/70  lung]
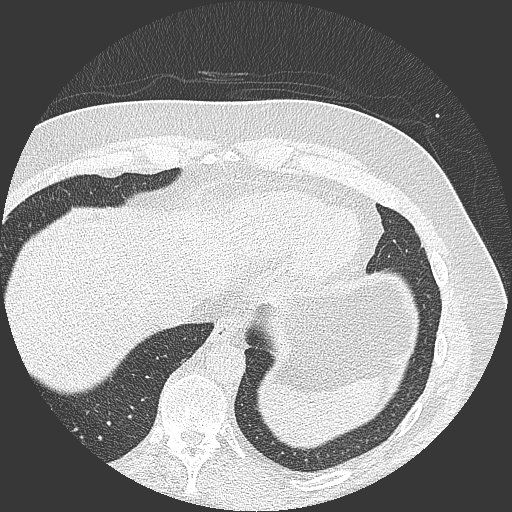
[im 21/70  mediastinal]
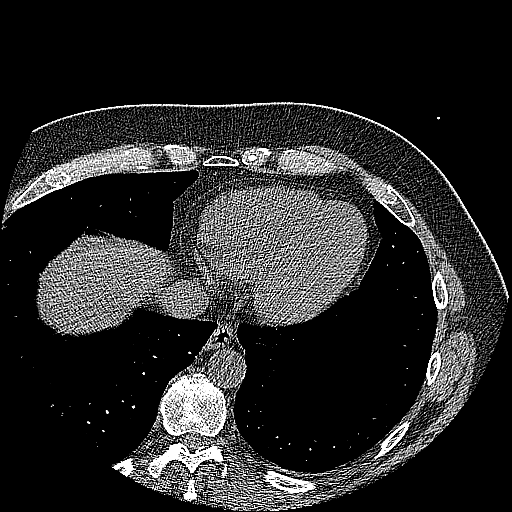
[im 21/70  lung]
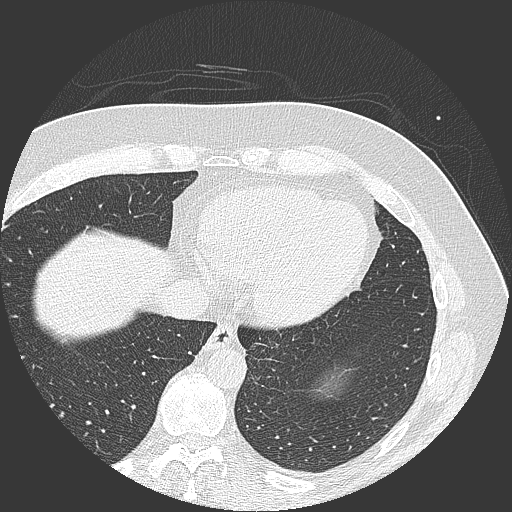
[im 26/70  lung]
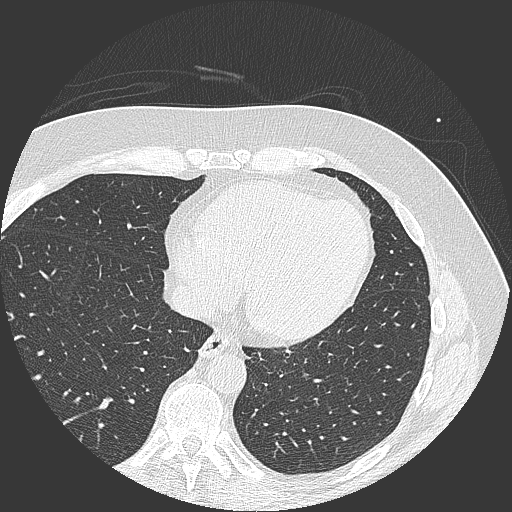
[im 29/70  lung]
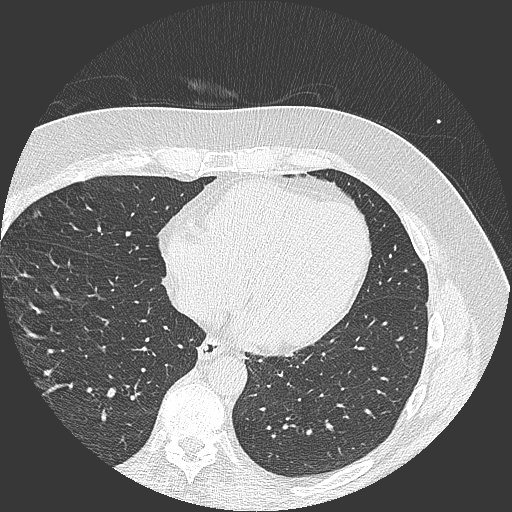
[im 32/70  lung]
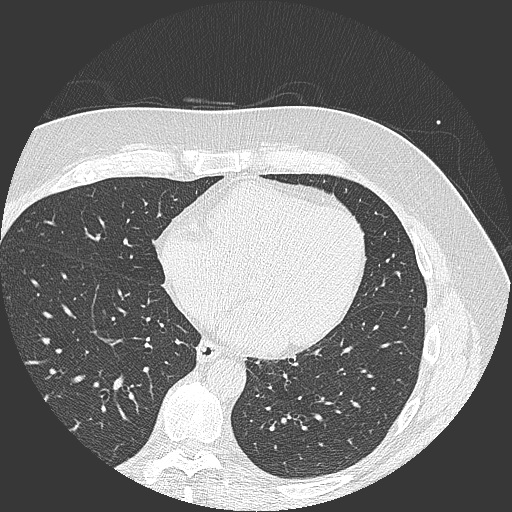
[im 36/70  mediastinal]
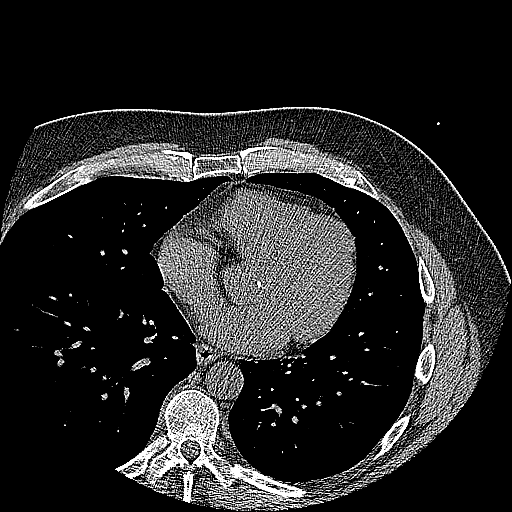
[im 36/70  lung]
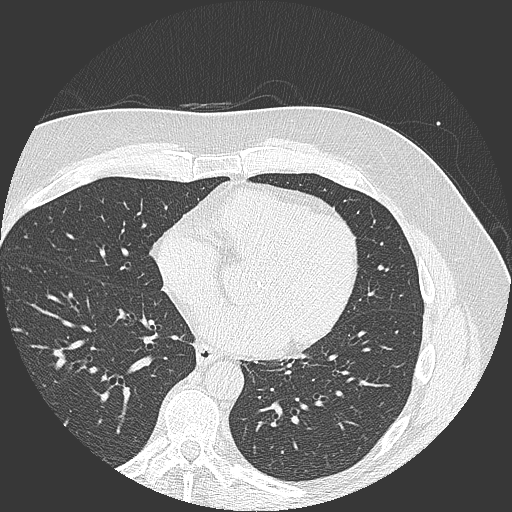
[im 41/70  lung]
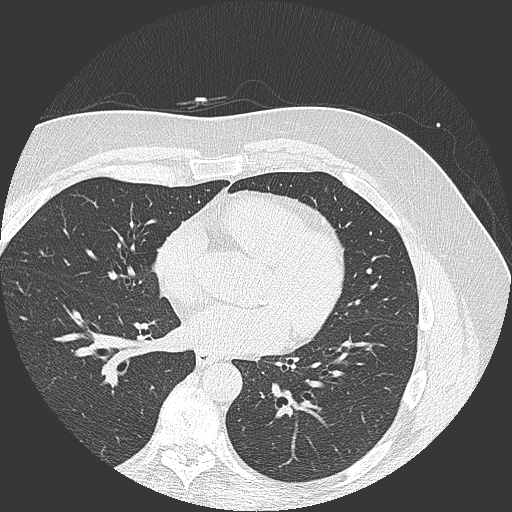
[im 44/70  lung]
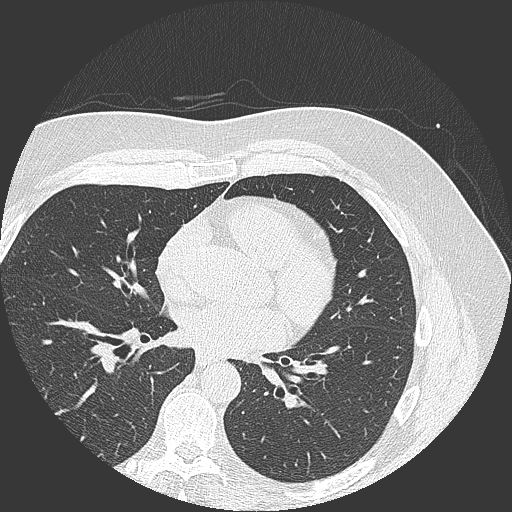
[im 49/70  lung]
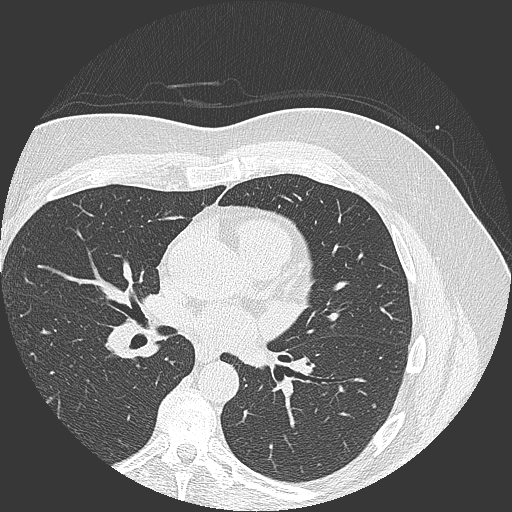
[im 54/70  mediastinal]
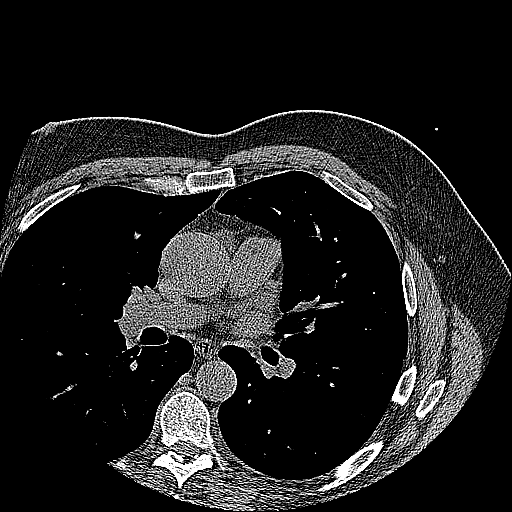
[im 54/70  lung]
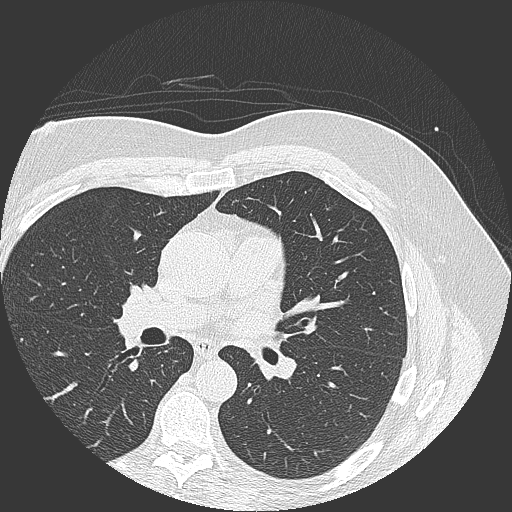
[im 57/70  lung]
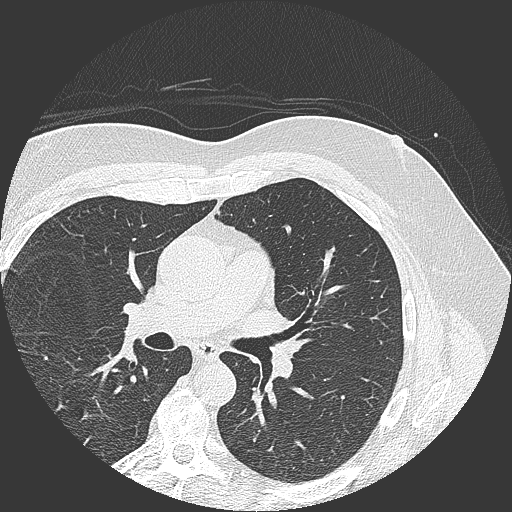
[im 62/70  lung]
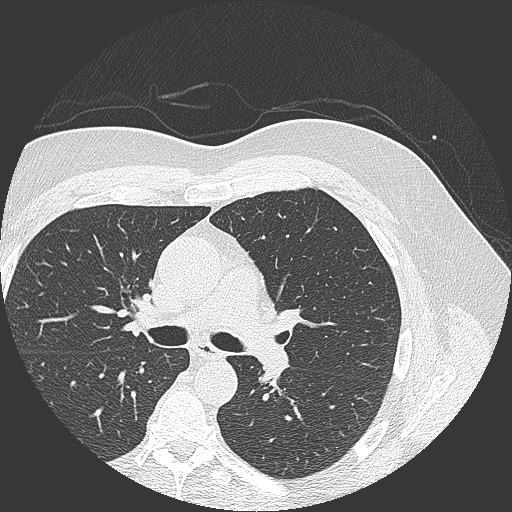
[im 67/70  lung]
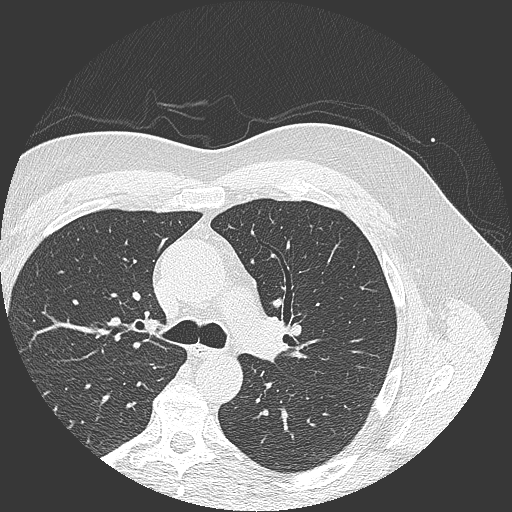

[16 of 33 positions shown; findings below may reference images not displayed]

FINDINGS: Partially visualized lungs are unremarkable. No pathologically enlarged mediastinal lymph node is identified. Dilatation of thoracic ascending aorta to 4.3 cm at the level of the right pulmonary artery ([DATE]). No acute abnormality in the upper abdomen. No acute fracture or malalignment. Chronic appearing compression deformity of the T8 vertebral body.
IMPRESSION: Dilatation of thoracic ascending aorta to 4.3 cm.
All CT scans at this facility use dose modulation and/or weight based dosing when appropriate to reduce radiation dose to as low as reasonably achievable.

## 2024-02-11 IMAGING — CT CT CALCIUM SCORING WITHOUT CONTRAST
1 series · 14 of 20 positions shown, 18 images · non-contrast
Comparison: none

Procedure: Coronary calcium score
PROTOCOL: Diastolic phase noncontrast imaging of the heart using [HOSPITAL] scanner.  Images analyzed and quantified on dedicated cardiac workstation.  Reformatted slice thickness 3 mm.  Noncardiac findings reported separately.
Coronary findings:
Agatson score
Left main: 0
LAD: 0
LCx: 0
RCA: 0
Total: 0
Results:
Total Agatston score (used for initial quantification): 0
Total volume (mm3) (used for following progression): 0
MESA (percent of peer group):

[Series 2: calcium score · axial · 0.49mm/px · z∈[-246,-91]mm · 14 of 70 slices shown, 18 images]
[im 4/70  vessel]
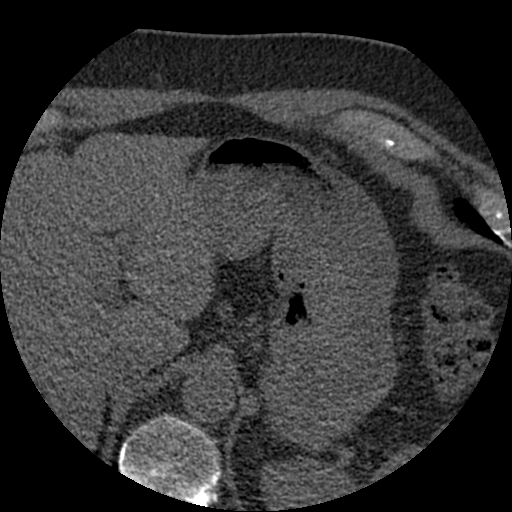
[im 4/70  lung]
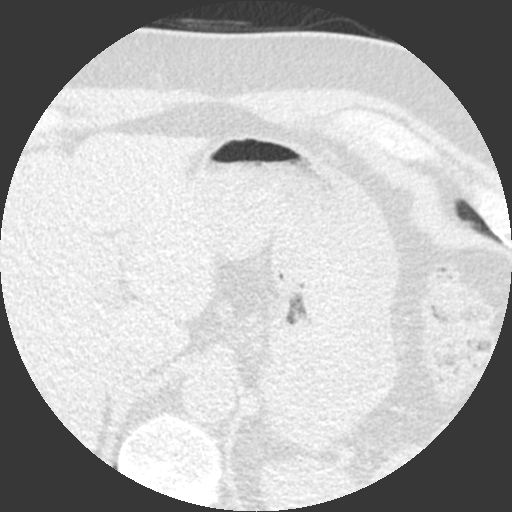
[im 8/70  vessel]
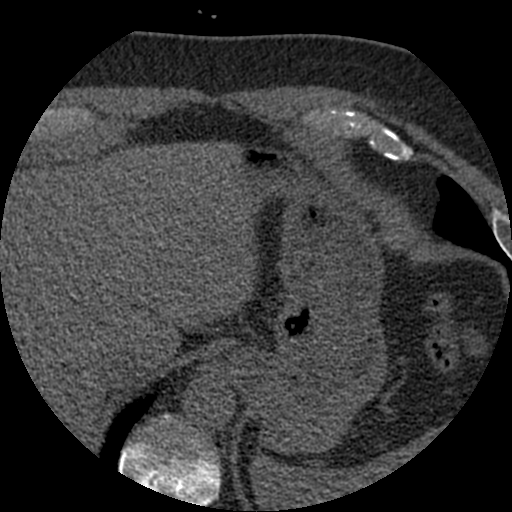
[im 15/70  vessel]
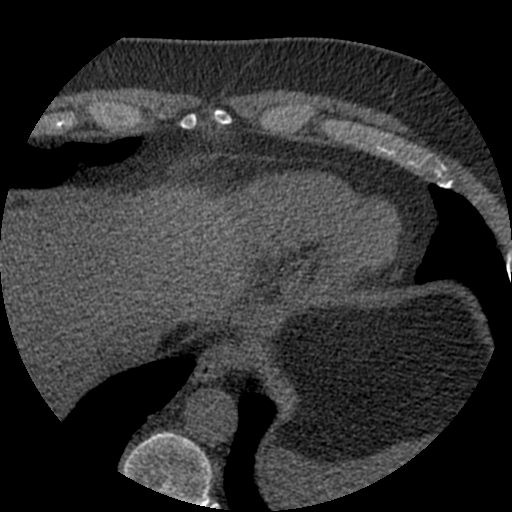
[im 19/70  vessel]
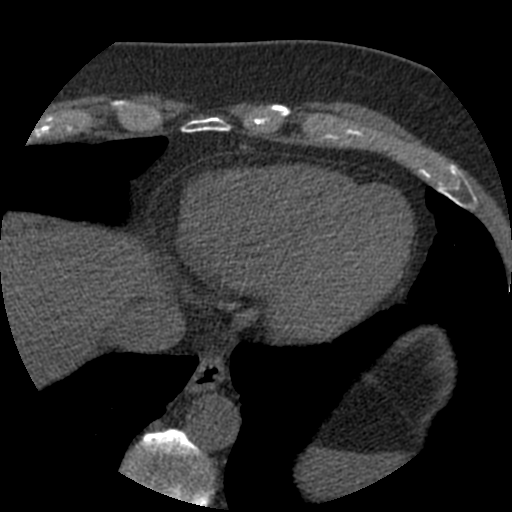
[im 22/70  vessel]
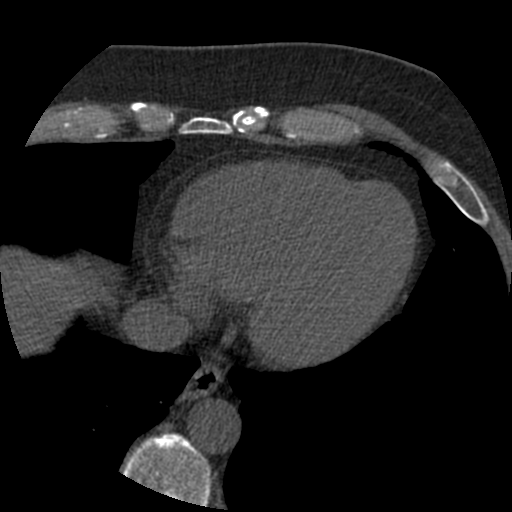
[im 22/70  lung]
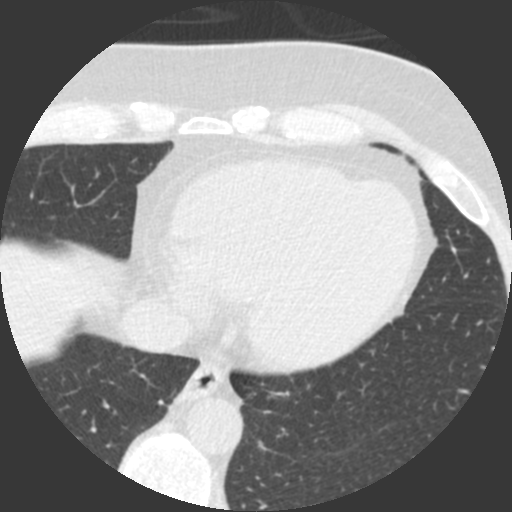
[im 30/70  vessel]
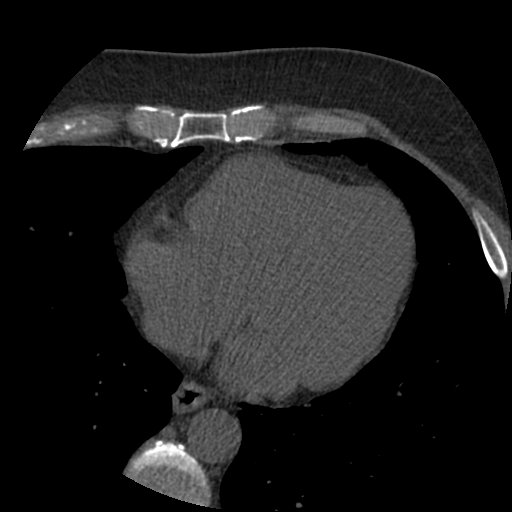
[im 33/70  vessel]
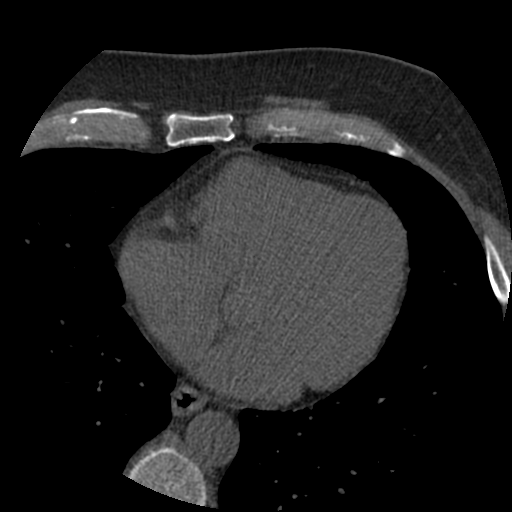
[im 37/70  vessel]
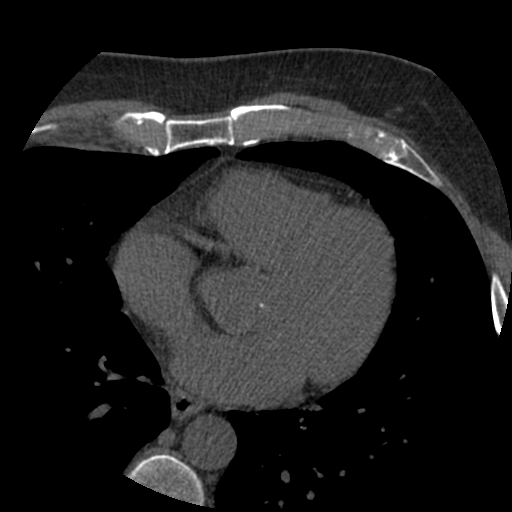
[im 40/70  vessel]
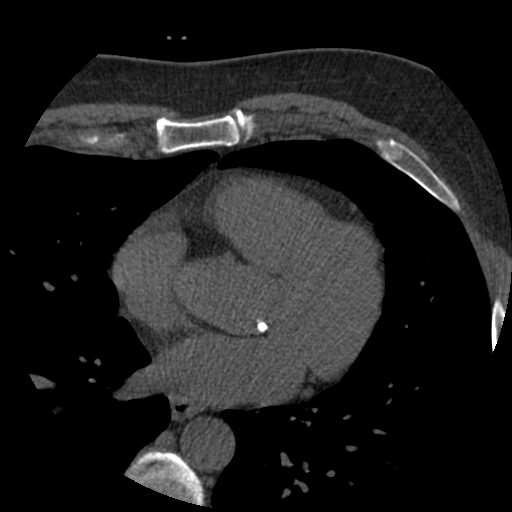
[im 40/70  lung]
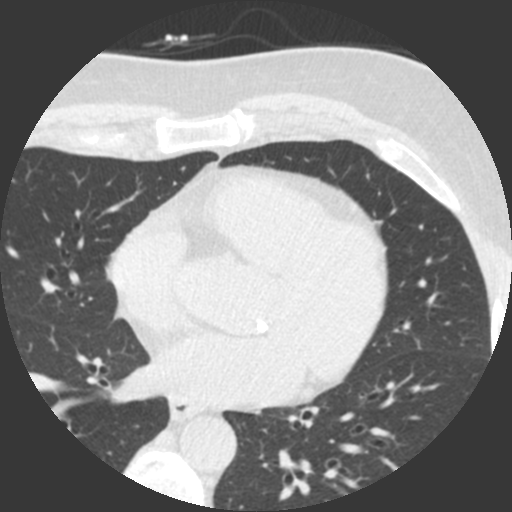
[im 48/70  vessel]
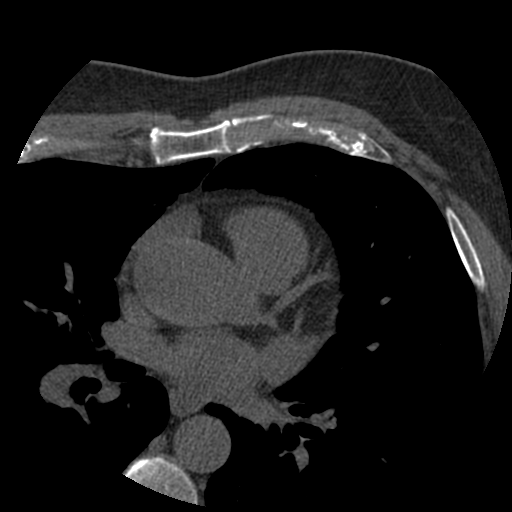
[im 51/70  vessel]
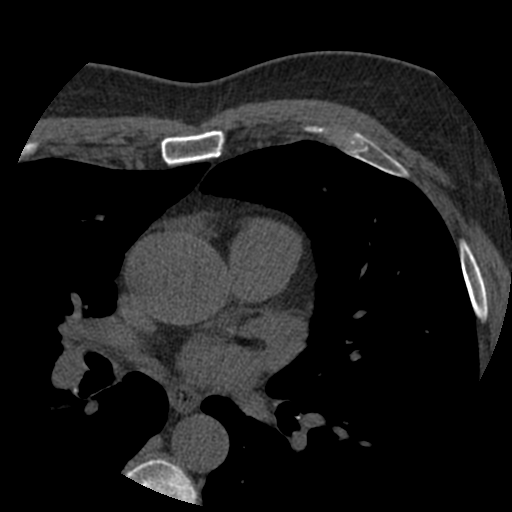
[im 55/70  vessel]
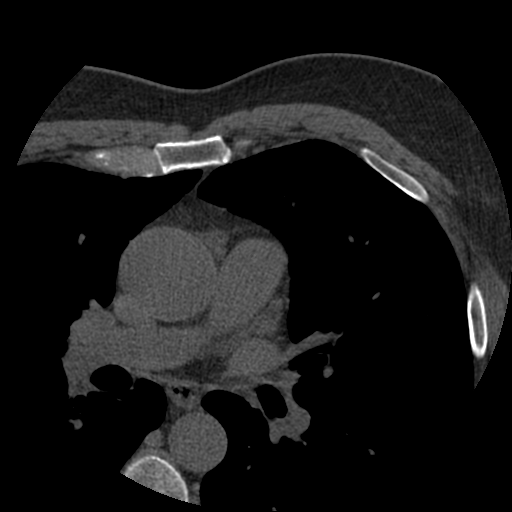
[im 62/70  vessel]
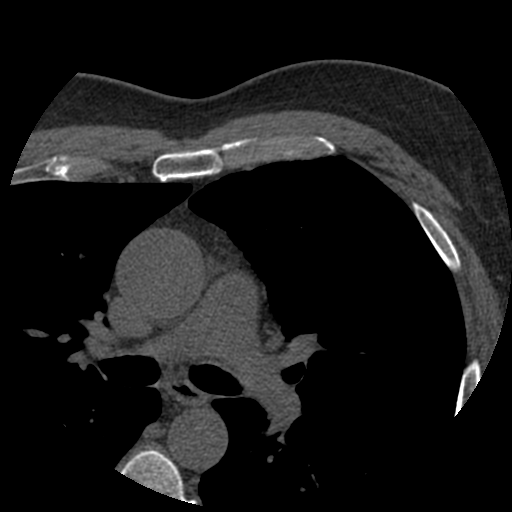
[im 62/70  lung]
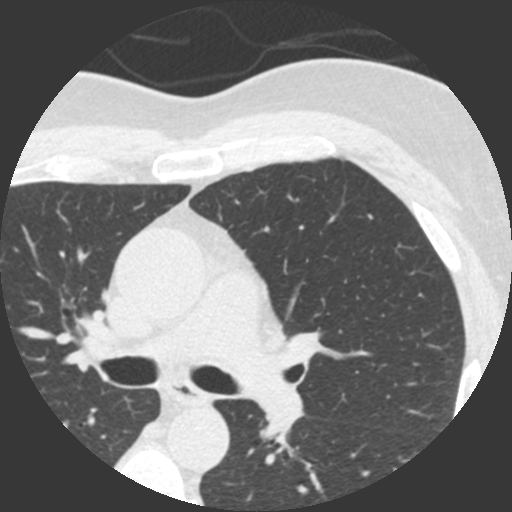
[im 66/70  vessel]
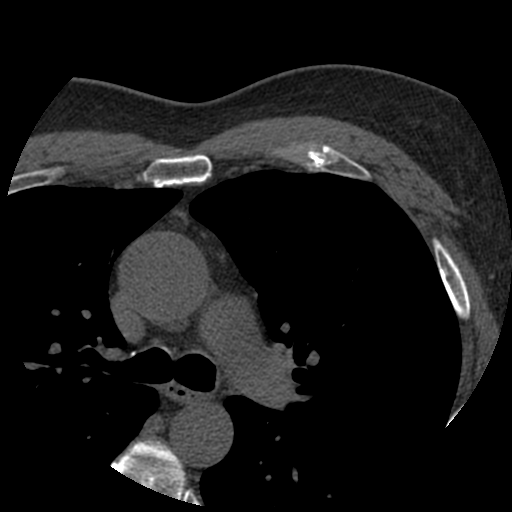

[14 of 20 positions shown; findings below may reference images not displayed]

SUMMARY: No coronary artery calcification identified
Recommendations:
-Risk factor modification
-Please refer to radiologist report for noncardiac CT findings
-Further care as directed by referring physician
Kiriga, Nabii Isaya
This encounter was completed with the assistance of voice recognition software.  Interpretive errors including those of syntax and "sound alike substitutions" which may escape proofreading may exist that were not detected at the time of sign off review. In such instances, original meeting may be extrapolated by contextual derivation.  Please excuse any inconvenience or confusion that may have occurred using the system. These errors may be subject to subsequent amendment.
*  Coronary calcium scoring has been validated in the 45-84 year old asymptomatic age group.
*  The absence of calcification does not exclude the presence of lipid-rich non-calcified atherosclerotic plaque.  The potential for a false negative study is more common in younger age patients (<45 years of age).
*  The MESA percentile compares CAD burden for a group of patients matched be age, gender, and ethnicity.  A higher % (e.g. 90%) means that the patient has more CAD than their peers.
*  Score and recommendations:
o  0 = no evidence of calcified plaque, consider repeat study in 3-4 years
o  1-99 = mild CAD burden, unlikely to have obstructive disease, focus on risk factor control
o  100-399 = moderate CAD burden, possible obstructive disease, focus on risk factor prevention, functional/stress testing can be considered
o  >/=400 = high CAD burden, recommend functional/stress testing to evaluate for obstructive coronary disease, recommend aggressive risk factor control
o  In cases where the MESA% is >75% (even in the absence of high CAD burden) consider aggressive risk factor control as patient has higher long-term risk of cardiac events than matched peers
